# Patient Record
Sex: Female | Born: 1985
Health system: Southern US, Community
[De-identification: ages and names within clinical notes are randomized; demographics above are authoritative.]

## PROBLEM LIST (undated history)

## (undated) ENCOUNTER — Inpatient Hospital Stay (HOSPITAL_COMMUNITY): Payer: Self-pay

## (undated) DIAGNOSIS — J309 Allergic rhinitis, unspecified: Secondary | ICD-10-CM

## (undated) DIAGNOSIS — O1490 Unspecified pre-eclampsia, unspecified trimester: Secondary | ICD-10-CM

## (undated) DIAGNOSIS — E739 Lactose intolerance, unspecified: Secondary | ICD-10-CM

## (undated) DIAGNOSIS — R112 Nausea with vomiting, unspecified: Secondary | ICD-10-CM

## (undated) DIAGNOSIS — K6289 Other specified diseases of anus and rectum: Secondary | ICD-10-CM

## (undated) DIAGNOSIS — K921 Melena: Secondary | ICD-10-CM

## (undated) DIAGNOSIS — T7840XA Allergy, unspecified, initial encounter: Secondary | ICD-10-CM

## (undated) DIAGNOSIS — G43909 Migraine, unspecified, not intractable, without status migrainosus: Secondary | ICD-10-CM

## (undated) DIAGNOSIS — R109 Unspecified abdominal pain: Secondary | ICD-10-CM

## (undated) DIAGNOSIS — L509 Urticaria, unspecified: Secondary | ICD-10-CM

## (undated) DIAGNOSIS — R87629 Unspecified abnormal cytological findings in specimens from vagina: Secondary | ICD-10-CM

## (undated) DIAGNOSIS — F419 Anxiety disorder, unspecified: Secondary | ICD-10-CM

## (undated) DIAGNOSIS — Z789 Other specified health status: Secondary | ICD-10-CM

## (undated) DIAGNOSIS — O139 Gestational [pregnancy-induced] hypertension without significant proteinuria, unspecified trimester: Secondary | ICD-10-CM

## (undated) DIAGNOSIS — N390 Urinary tract infection, site not specified: Secondary | ICD-10-CM

## (undated) DIAGNOSIS — H1010 Acute atopic conjunctivitis, unspecified eye: Secondary | ICD-10-CM

## (undated) HISTORY — DX: Anxiety disorder, unspecified: F41.9

## (undated) HISTORY — DX: Nausea with vomiting, unspecified: R11.2

## (undated) HISTORY — DX: Urinary tract infection, site not specified: N39.0

## (undated) HISTORY — DX: Unspecified pre-eclampsia, unspecified trimester: O14.90

## (undated) HISTORY — DX: Other specified diseases of anus and rectum: K62.89

## (undated) HISTORY — DX: Melena: K92.1

## (undated) HISTORY — DX: Gestational (pregnancy-induced) hypertension without significant proteinuria, unspecified trimester: O13.9

## (undated) HISTORY — DX: Allergic rhinitis, unspecified: J30.9

## (undated) HISTORY — DX: Urticaria, unspecified: L50.9

## (undated) HISTORY — DX: Migraine, unspecified, not intractable, without status migrainosus: G43.909

## (undated) HISTORY — DX: Allergic rhinitis, unspecified: H10.10

## (undated) HISTORY — DX: Lactose intolerance, unspecified: E73.9

## (undated) HISTORY — PX: BREAST BIOPSY: SHX20

## (undated) HISTORY — DX: Unspecified abdominal pain: R10.9

## (undated) HISTORY — DX: Allergy, unspecified, initial encounter: T78.40XA

---
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
## 2005-04-09 ENCOUNTER — Other Ambulatory Visit: Admission: RE | Admit: 2005-04-09 | Discharge: 2005-04-09 | Payer: Self-pay | Admitting: Obstetrics and Gynecology

## 2005-10-16 ENCOUNTER — Other Ambulatory Visit: Admission: RE | Admit: 2005-10-16 | Discharge: 2005-10-16 | Payer: Self-pay | Admitting: Obstetrics and Gynecology

## 2008-09-01 HISTORY — PX: TONSILLECTOMY: SUR1361

## 2008-12-05 ENCOUNTER — Encounter: Admission: RE | Admit: 2008-12-05 | Discharge: 2008-12-05 | Payer: Self-pay | Admitting: Family Medicine

## 2009-01-19 ENCOUNTER — Encounter: Admission: RE | Admit: 2009-01-19 | Discharge: 2009-01-19 | Payer: Self-pay | Admitting: Gastroenterology

## 2009-03-12 ENCOUNTER — Emergency Department (HOSPITAL_COMMUNITY): Admission: EM | Admit: 2009-03-12 | Discharge: 2009-03-12 | Payer: Self-pay | Admitting: Emergency Medicine

## 2009-07-06 ENCOUNTER — Ambulatory Visit (HOSPITAL_COMMUNITY): Admission: RE | Admit: 2009-07-06 | Discharge: 2009-07-06 | Payer: Self-pay | Admitting: Gastroenterology

## 2009-09-06 ENCOUNTER — Encounter: Admission: RE | Admit: 2009-09-06 | Discharge: 2009-09-06 | Payer: Self-pay | Admitting: Gastroenterology

## 2010-09-22 ENCOUNTER — Encounter: Payer: Self-pay | Admitting: Urology

## 2010-12-08 LAB — COMPREHENSIVE METABOLIC PANEL
ALT: 19 U/L (ref 0–35)
Alkaline Phosphatase: 49 U/L (ref 39–117)
Glucose, Bld: 80 mg/dL (ref 70–99)
Potassium: 4.6 mEq/L (ref 3.5–5.1)
Sodium: 136 mEq/L (ref 135–145)
Total Protein: 6.8 g/dL (ref 6.0–8.3)

## 2010-12-08 LAB — URINALYSIS, ROUTINE W REFLEX MICROSCOPIC
Bilirubin Urine: NEGATIVE
Glucose, UA: NEGATIVE mg/dL
Hgb urine dipstick: NEGATIVE
Ketones, ur: NEGATIVE mg/dL
Protein, ur: NEGATIVE mg/dL

## 2010-12-08 LAB — DIFFERENTIAL
Basophils Relative: 0 % (ref 0–1)
Eosinophils Absolute: 0.1 10*3/uL (ref 0.0–0.7)
Eosinophils Relative: 2 % (ref 0–5)
Monocytes Absolute: 0.6 10*3/uL (ref 0.1–1.0)
Monocytes Relative: 7 % (ref 3–12)
Neutrophils Relative %: 68 % (ref 43–77)

## 2010-12-08 LAB — CBC
Hemoglobin: 13.2 g/dL (ref 12.0–15.0)
RBC: 4.17 MIL/uL (ref 3.87–5.11)
RDW: 12.6 % (ref 11.5–15.5)

## 2011-10-21 ENCOUNTER — Ambulatory Visit
Admission: RE | Admit: 2011-10-21 | Discharge: 2011-10-21 | Disposition: A | Discharge status: Home / Self Care | Payer: BC Managed Care – PPO | Source: Ambulatory Visit | Attending: Allergy | Admitting: Allergy

## 2011-10-21 ENCOUNTER — Other Ambulatory Visit: Payer: Self-pay | Admitting: Allergy

## 2011-10-21 DIAGNOSIS — J329 Chronic sinusitis, unspecified: Secondary | ICD-10-CM

## 2013-03-01 LAB — OB RESULTS CONSOLE HEPATITIS B SURFACE ANTIGEN: Hepatitis B Surface Ag: NEGATIVE

## 2013-03-01 LAB — OB RESULTS CONSOLE ABO/RH: RH Type: POSITIVE

## 2013-03-01 LAB — OB RESULTS CONSOLE GC/CHLAMYDIA
CHLAMYDIA, DNA PROBE: NEGATIVE
GC PROBE AMP, GENITAL: NEGATIVE

## 2013-03-01 LAB — OB RESULTS CONSOLE GBS: STREP GROUP B AG: NEGATIVE

## 2013-03-01 LAB — OB RESULTS CONSOLE RPR: RPR: NONREACTIVE

## 2013-03-01 LAB — OB RESULTS CONSOLE ANTIBODY SCREEN: ANTIBODY SCREEN: NEGATIVE

## 2013-03-01 LAB — OB RESULTS CONSOLE HIV ANTIBODY (ROUTINE TESTING): HIV: NONREACTIVE

## 2013-03-01 LAB — OB RESULTS CONSOLE RUBELLA ANTIBODY, IGM: Rubella: IMMUNE

## 2013-09-01 NOTE — L&D Delivery Note (Signed)
Delivery Note At 3:08 PM a viable female was delivered via Vaginal, Spontaneous Delivery Apgar 9 9   Placenta status:spontaneously with 3 vessel cord , .  Cord:  with the following complications:none .  Cord pH: not obtained  Anesthesia: Epidural  Episiotomy: None Lacerations: None Suture Repair: 3.0 chromic Est. Blood Loss (mL): 300  Mom to postpartum.  Baby to Couplet care / Skin to Skin.  [name] L 09/14/2013, 3:43 PM

## 2013-09-11 ENCOUNTER — Encounter (HOSPITAL_COMMUNITY): Payer: Self-pay | Admitting: *Deleted

## 2013-09-11 ENCOUNTER — Inpatient Hospital Stay (HOSPITAL_COMMUNITY)
Admission: AD | Admit: 2013-09-11 | Discharge: 2013-09-11 | Disposition: A | Discharge status: Home / Self Care | Payer: BC Managed Care – PPO | Source: Ambulatory Visit | Attending: Obstetrics and Gynecology | Admitting: Obstetrics and Gynecology

## 2013-09-11 DIAGNOSIS — R030 Elevated blood-pressure reading, without diagnosis of hypertension: Secondary | ICD-10-CM | POA: Insufficient documentation

## 2013-09-11 DIAGNOSIS — IMO0001 Reserved for inherently not codable concepts without codable children: Secondary | ICD-10-CM

## 2013-09-11 DIAGNOSIS — O479 False labor, unspecified: Secondary | ICD-10-CM | POA: Insufficient documentation

## 2013-09-11 DIAGNOSIS — O9989 Other specified diseases and conditions complicating pregnancy, childbirth and the puerperium: Principal | ICD-10-CM

## 2013-09-11 DIAGNOSIS — O99891 Other specified diseases and conditions complicating pregnancy: Secondary | ICD-10-CM | POA: Insufficient documentation

## 2013-09-11 HISTORY — DX: Other specified health status: Z78.9

## 2013-09-11 LAB — CBC
HEMATOCRIT: 33.9 % — AB (ref 36.0–46.0)
Hemoglobin: 11.9 g/dL — ABNORMAL LOW (ref 12.0–15.0)
MCH: 30.2 pg (ref 26.0–34.0)
MCHC: 35.1 g/dL (ref 30.0–36.0)
MCV: 86 fL (ref 78.0–100.0)
PLATELETS: 204 10*3/uL (ref 150–400)
RBC: 3.94 MIL/uL (ref 3.87–5.11)
RDW: 12.7 % (ref 11.5–15.5)
WBC: 10.6 10*3/uL — AB (ref 4.0–10.5)

## 2013-09-11 LAB — URINALYSIS, ROUTINE W REFLEX MICROSCOPIC
Bilirubin Urine: NEGATIVE
GLUCOSE, UA: NEGATIVE mg/dL
KETONES UR: NEGATIVE mg/dL
Nitrite: NEGATIVE
PH: 6 (ref 5.0–8.0)
Protein, ur: NEGATIVE mg/dL
Specific Gravity, Urine: 1.02 (ref 1.005–1.030)
Urobilinogen, UA: 0.2 mg/dL (ref 0.0–1.0)

## 2013-09-11 LAB — URINE MICROSCOPIC-ADD ON

## 2013-09-11 LAB — COMPREHENSIVE METABOLIC PANEL
ALBUMIN: 2.8 g/dL — AB (ref 3.5–5.2)
ALK PHOS: 130 U/L — AB (ref 39–117)
ALT: 13 U/L (ref 0–35)
AST: 20 U/L (ref 0–37)
BUN: 11 mg/dL (ref 6–23)
CO2: 24 mEq/L (ref 19–32)
Calcium: 8.7 mg/dL (ref 8.4–10.5)
Chloride: 105 mEq/L (ref 96–112)
Creatinine, Ser: 0.65 mg/dL (ref 0.50–1.10)
GFR calc Af Amer: >90 mL/min (ref >90)
GFR calc non Af Amer: >90 mL/min (ref >90)
Glucose, Bld: 77 mg/dL (ref 70–99)
POTASSIUM: 4.6 meq/L (ref 3.7–5.3)
SODIUM: 138 meq/L (ref 137–147)
TOTAL PROTEIN: 6.1 g/dL (ref 6.0–8.3)
Total Bilirubin: <0.2 mg/dL — ABNORMAL LOW (ref 0.3–1.2)

## 2013-09-11 LAB — WET PREP, GENITAL
CLUE CELLS WET PREP: NONE SEEN
Trich, Wet Prep: NONE SEEN
YEAST WET PREP: NONE SEEN

## 2013-09-11 LAB — PROTEIN / CREATININE RATIO, URINE
CREATININE, URINE: 74.31 mg/dL
PROTEIN CREATININE RATIO: 0.1 (ref 0.00–0.15)
TOTAL PROTEIN, URINE: 7.2 mg/dL

## 2013-09-11 LAB — URIC ACID: URIC ACID, SERUM: 4.6 mg/dL (ref 2.4–7.0)

## 2013-09-11 LAB — AMNISURE RUPTURE OF MEMBRANE (ROM) NOT AT ARMC: AMNISURE: NEGATIVE

## 2013-09-11 LAB — LACTATE DEHYDROGENASE: LDH: 187 U/L (ref 94–250)

## 2013-09-11 NOTE — MAU Provider Note (Signed)
Chief Complaint:  Rupture of Membranes   First Provider Initiated Contact with Patient 09/11/13 1716      HPI: Nancy Boyer is a 28 y.o. G1P0 at 3855w5d who presents to maternity admissions reporting gush of clear fluid into toilet after urinating at noon today.  She had more fluid, enough to soak a pantyliner x1 afterwards but no current leakage.  She reports contractions x2 which were painful last night but none today.  She has hx of frequent yeast infections during this pregnancy but denies itching/burning today.  She reports good fetal movement, denies regular contractions, h/a, epigastric pain, visual disturbances, vaginal bleeding, vaginal itching/burning, urinary symptoms, dizziness, n/v, or fever/chills.    Past Medical History: Past Medical History  Diagnosis Date  . Medical history non-contributory     Past obstetric history: OB History  Gravida Para Term Preterm AB SAB TAB Ectopic Multiple Living  1             # Outcome Date GA Lbr Len/2nd Weight Sex Delivery Anes PTL Lv  1 CUR               Past Surgical History: Past Surgical History  Procedure Laterality Date  . Tonsillectomy    . Wisdom tooth extraction      Family History: History reviewed. No pertinent family history.  Social History: History  Substance Use Topics  . Smoking status: Never Smoker   . Smokeless tobacco: Never Used  . Alcohol Use: No    Allergies:  Allergies  Allergen Reactions  . Penicillins Swelling and Rash  . Codeine Other (See Comments)    Unknown childhood rxn    Meds:  No prescriptions prior to admission    ROS: Pertinent findings in history of present illness.  Physical Exam  Blood pressure 143/93, pulse 82, temperature 98.3 F (36.8 C), resp. rate 18, height 5\' 4"  (1.626 m), weight 69.4 kg (153 lb), last menstrual period 12/14/2012. GENERAL: Well-developed, well-nourished female in no acute distress.  HEENT: normocephalic HEART: normal rate RESP: normal  effort ABDOMEN: Soft, non-tender, gravid appropriate for gestational age EXTREMITIES: Nontender, no edema NEURO: alert and oriented SPECULUM EXAM: Negative pooling, moderate amount thick white discharge clinging to vaginal walls  Dilation: 3 Effacement (%): 90 Cervical Position: Posterior Presentation: Vertex Exam by:: LLeftwich-Kirby, CNM Negative ferning on slide  FHT:  Baseline 145 , moderate variability, accelerations present, no decelerations Contractions: irregular, mild to palpation   Labs: Results for orders placed during the hospital encounter of 09/11/13 (from the past 24 hour(s))  AMNISURE RUPTURE OF MEMBRANE (ROM)     Status: None   Collection Time    09/11/13  5:24 PM      Result Value Range   Amnisure ROM NEGATIVE    WET PREP, GENITAL     Status: Abnormal   Collection Time    09/11/13  5:24 PM      Result Value Range   Yeast Wet Prep HPF POC NONE SEEN  NONE SEEN   Trich, Wet Prep NONE SEEN  NONE SEEN   Clue Cells Wet Prep HPF POC NONE SEEN  NONE SEEN   WBC, Wet Prep HPF POC MANY (*) NONE SEEN  URINALYSIS, ROUTINE W REFLEX MICROSCOPIC     Status: Abnormal   Collection Time    09/11/13  5:30 PM      Result Value Range   Color, Urine YELLOW  YELLOW   APPearance CLEAR  CLEAR   Specific Gravity, Urine 1.020  1.005 - 1.030   pH 6.0  5.0 - 8.0   Glucose, UA NEGATIVE  NEGATIVE mg/dL   Hgb urine dipstick TRACE (*) NEGATIVE   Bilirubin Urine NEGATIVE  NEGATIVE   Ketones, ur NEGATIVE  NEGATIVE mg/dL   Protein, ur NEGATIVE  NEGATIVE mg/dL   Urobilinogen, UA 0.2  0.0 - 1.0 mg/dL   Nitrite NEGATIVE  NEGATIVE   Leukocytes, UA TRACE (*) NEGATIVE  PROTEIN / CREATININE RATIO, URINE     Status: None   Collection Time    09/11/13  5:30 PM      Result Value Range   Creatinine, Urine 74.31     Total Protein, Urine 7.2     PROTEIN CREATININE RATIO 0.10  0.00 - 0.15  URINE MICROSCOPIC-ADD ON     Status: Abnormal   Collection Time    09/11/13  5:30 PM      Result Value  Range   Squamous Epithelial / LPF FEW (*) RARE   WBC, UA 3-6  <3 WBC/hpf   RBC / HPF 0-2  <3 RBC/hpf   Bacteria, UA FEW (*) RARE  CBC     Status: Abnormal   Collection Time    09/11/13  5:40 PM      Result Value Range   WBC 10.6 (*) 4.0 - 10.5 K/uL   RBC 3.94  3.87 - 5.11 MIL/uL   Hemoglobin 11.9 (*) 12.0 - 15.0 g/dL   HCT 16.1 (*) 09.6 - 04.5 %   MCV 86.0  78.0 - 100.0 fL   MCH 30.2  26.0 - 34.0 pg   MCHC 35.1  30.0 - 36.0 g/dL   RDW 40.9  81.1 - 91.4 %   Platelets 204  150 - 400 K/uL  COMPREHENSIVE METABOLIC PANEL     Status: Abnormal   Collection Time    09/11/13  5:40 PM      Result Value Range   Sodium 138  137 - 147 mEq/L   Potassium 4.6  3.7 - 5.3 mEq/L   Chloride 105  96 - 112 mEq/L   CO2 24  19 - 32 mEq/L   Glucose, Bld 77  70 - 99 mg/dL   BUN 11  6 - 23 mg/dL   Creatinine, Ser 7.82  0.50 - 1.10 mg/dL   Calcium 8.7  8.4 - 95.6 mg/dL   Total Protein 6.1  6.0 - 8.3 g/dL   Albumin 2.8 (*) 3.5 - 5.2 g/dL   AST 20  0 - 37 U/L   ALT 13  0 - 35 U/L   Alkaline Phosphatase 130 (*) 39 - 117 U/L   Total Bilirubin <0.2 (*) 0.3 - 1.2 mg/dL   GFR calc non Af Amer >90  >90 mL/min   GFR calc Af Amer >90  >90 mL/min  URIC ACID     Status: None   Collection Time    09/11/13  5:40 PM      Result Value Range   Uric Acid, Serum 4.6  2.4 - 7.0 mg/dL  LACTATE DEHYDROGENASE     Status: None   Collection Time    09/11/13  5:40 PM      Result Value Range   LDH 187  94 - 250 U/L    Assessment: 1. Threatened labor at term   2. Elevated BP     Plan: Consult Dr Renaldo Fiddler Discharge home with preeclampsia and labor precautions Call office tomorrow, come in for BP check Return to MAU as needed  Follow-up Information   Follow up with [facility], [place].. Schedule an appointment as soon as possible for a visit in 1 day.   Contact information:   [address] [place] [address] [phone]       Medication List          acetaminophen 500 MG tablet  Commonly known as:  TYLENOL  Take 1,000 mg by mouth every 6 (six) hours as needed for mild pain.     prenatal multivitamin Tabs tablet  Take 1 tablet by mouth daily at 12 noon.     valACYclovir 500 MG tablet  Commonly known as:  VALTREX  Take 500 mg by mouth daily as needed (cold sore outbreak).        Sharen Counter Certified Nurse-Midwife 09/11/2013 8:24 PM

## 2013-09-11 NOTE — MAU Note (Signed)
Pt states here for lof around 12 noon. Clear fluid. Was seen 09/08/2013 and was 3cm/80% per Dr. Vincente PoliGrewal.

## 2013-09-11 NOTE — MAU Note (Signed)
Pt presents with complaints of leakage of fluid since noon today. Denies any bleeding. States baby is active.

## 2013-09-11 NOTE — Discharge Instructions (Signed)
Reasons to return to MAU:  ° °1.  Contractions are  5 minutes apart or less, each last 1 minute, these have been going on for 1-2 hours, and you cannot walk or talk during them °2.  You have a large gush of fluid, or a trickle of fluid that will not stop and you have to wear a pad °3.  You have bleeding that is bright red, heavier than spotting--like menstrual bleeding (spotting can be normal in early labor or after a check of your cervix) °4.  You do not feel the baby moving like he/she normally does ° °Hypertension During Pregnancy °Hypertension is also called high blood pressure. It can occur at any time in life and during pregnancy. When you have hypertension, there is extra pressure inside your blood vessels that carry blood from the heart to the rest of your body (arteries). Hypertension during pregnancy can cause problems for you and your baby. Your baby might not weigh as much as it should at birth or might be born early (premature). Very bad cases of hypertension during pregnancy can be life threatening.  °Different types of hypertension can occur during pregnancy.  °· Chronic hypertension. This happens when a woman has hypertension before pregnancy and it continues during pregnancy. °· Gestational hypertension. This is when hypertension develops during pregnancy. °· Preeclampsia or toxemia of pregnancy. This is a very serious type of hypertension that develops only during pregnancy. It is a disease that affects the whole body (systemic) and can be very dangerous for both mother and baby.   °Gestational hypertension and preeclampsia usually go away after your baby is born. Blood pressure generally stabilizes within 6 weeks. Women who have hypertension during pregnancy have a greater chance of developing hypertension later in life or with future pregnancies. °RISK FACTORS °Some factors make you more likely to develop hypertension during pregnancy. Risk factors include: °· Having hypertension before  pregnancy. °· Having hypertension during a previous pregnancy. °· Being overweight. °· Being older than 40. °· Being pregnant with more than one baby (multiples). °· Having diabetes or kidney problems. °SIGNS AND SYMPTOMS °Chronic and gestational hypertension rarely cause symptoms. Preeclampsia has symptoms, which may include: °· Increased protein in your urine. Your health care provider will check for this at every prenatal visit. °· Swelling of your hands and face. °· Rapid weight gain. °· Headaches. °· Visual changes. °· Being bothered by light. °· Abdominal pain, especially in the right upper area. °· Chest pain. °· Shortness of breath. °· Increased reflexes. °· Seizures. Seizures occur with a more severe form of preeclampsia, called eclampsia. °DIAGNOSIS  °· You may be diagnosed with hypertension during a regular prenatal exam. At each visit, tests may include: °· Blood pressure checks. °· A urine test to check for protein in your urine. °· The type of hypertension you are diagnosed with depends on when you developed it. It also depends on your specific blood pressure reading. °· Developing hypertension before 20 weeks of pregnancy is consistent with chronic hypertension. °· Developing hypertension after 20 weeks of pregnancy is consistent with gestational hypertension. °· Hypertension with increased urinary protein is diagnosed as preeclampsia. °· Blood pressure measurements that stay above 160 systolic or 110 diastolic are a sign of severe preeclampsia. °TREATMENT °Treatment for hypertension during pregnancy varies. Treatment depends on the type of hypertension and how serious it is. °· If you take medicine for chronic hypertension, you may need to switch medicines. °· Drugs called ACE inhibitors should not be taken   during pregnancy. °· Low-dose aspirin may be suggested for women who have risk factors for preeclampsia. °· If you have gestational hypertension, you may need to take a blood pressure medicine  that is safe during pregnancy. Your health care provider will recommend the appropriate medicine. °· If you have severe preeclampsia, you may need to be in the hospital. Health care providers will watch you and the baby very closely. You also may need to take medicine (magnesium sulfate) to prevent seizures and lower blood pressure. °· Sometimes an early delivery is needed. This may be the case if the condition worsens. It would be done to protect you and the baby. The only cure for preeclampsia is delivery. °HOME CARE INSTRUCTIONS °· Schedule and keep all of your regular appointments for prenatal care. °· Only take over-the-counter or prescription medicines as directed by your health care provider. Tell your health care provider about all medicines you take. °· Eat as little salt as possible. °· Get regular exercise. °· Do not drink alcohol. °· Do not use tobacco products. °· Do not drink products with caffeine. °· Lie on your left side when resting. °SEEK IMMEDIATE MEDICAL CARE IF: °· You have severe abdominal pain. °· You have sudden swelling in the hands, ankles, or face. °· You gain 4 pounds (1.8 kg) or more in 1 week. °· You vomit repeatedly. °· You have vaginal bleeding. °· You do not feel the baby moving as much. °· You have a headache. °· You have blurred or double vision. °· You have muscle twitching or spasms. °· You have shortness of breath. °· You have blue fingernails and lips. °· You have blood in your urine. °MAKE SURE YOU: °· Understand these instructions. °· Will watch your condition. °· Will get help right away if you are not doing well or get worse. °Document Released: 05/06/2011 Document Revised: 06/08/2013 Document Reviewed: 03/17/2013 °[employer]® Patient Information ©2014 [employer]. ° °

## 2013-09-12 LAB — URINE CULTURE: Colony Count: 6000

## 2013-09-13 ENCOUNTER — Encounter (HOSPITAL_COMMUNITY): Payer: Self-pay | Admitting: *Deleted

## 2013-09-13 ENCOUNTER — Inpatient Hospital Stay (HOSPITAL_COMMUNITY)
Admission: AD | Admit: 2013-09-13 | Discharge: 2013-09-16 | DRG: 775 | Disposition: A | Discharge status: Home / Self Care | Payer: BC Managed Care – PPO | Source: Ambulatory Visit | Attending: Obstetrics and Gynecology | Admitting: Obstetrics and Gynecology

## 2013-09-13 DIAGNOSIS — O139 Gestational [pregnancy-induced] hypertension without significant proteinuria, unspecified trimester: Principal | ICD-10-CM | POA: Diagnosis present

## 2013-09-13 DIAGNOSIS — Z3490 Encounter for supervision of normal pregnancy, unspecified, unspecified trimester: Secondary | ICD-10-CM

## 2013-09-13 LAB — COMPREHENSIVE METABOLIC PANEL
ALK PHOS: 132 U/L — AB (ref 39–117)
ALT: 11 U/L (ref 0–35)
AST: 20 U/L (ref 0–37)
Albumin: 2.9 g/dL — ABNORMAL LOW (ref 3.5–5.2)
BUN: 8 mg/dL (ref 6–23)
CO2: 22 mEq/L (ref 19–32)
CREATININE: 0.63 mg/dL (ref 0.50–1.10)
Calcium: 8.9 mg/dL (ref 8.4–10.5)
Chloride: 102 mEq/L (ref 96–112)
GFR calc Af Amer: >90 mL/min (ref >90)
GFR calc non Af Amer: >90 mL/min (ref >90)
Glucose, Bld: 67 mg/dL — ABNORMAL LOW (ref 70–99)
POTASSIUM: 3.8 meq/L (ref 3.7–5.3)
Sodium: 136 mEq/L — ABNORMAL LOW (ref 137–147)
TOTAL PROTEIN: 6.5 g/dL (ref 6.0–8.3)
Total Bilirubin: 0.3 mg/dL (ref 0.3–1.2)

## 2013-09-13 LAB — URINALYSIS, ROUTINE W REFLEX MICROSCOPIC
BILIRUBIN URINE: NEGATIVE
Glucose, UA: NEGATIVE mg/dL
Ketones, ur: NEGATIVE mg/dL
NITRITE: NEGATIVE
PH: 6 (ref 5.0–8.0)
Protein, ur: NEGATIVE mg/dL
SPECIFIC GRAVITY, URINE: 1.015 (ref 1.005–1.030)
Urobilinogen, UA: 0.2 mg/dL (ref 0.0–1.0)

## 2013-09-13 LAB — CBC
HCT: 36.2 % (ref 36.0–46.0)
Hemoglobin: 12.8 g/dL (ref 12.0–15.0)
MCH: 30.5 pg (ref 26.0–34.0)
MCHC: 35.4 g/dL (ref 30.0–36.0)
MCV: 86.4 fL (ref 78.0–100.0)
PLATELETS: 216 10*3/uL (ref 150–400)
RBC: 4.19 MIL/uL (ref 3.87–5.11)
RDW: 12.9 % (ref 11.5–15.5)
WBC: 10.5 10*3/uL (ref 4.0–10.5)

## 2013-09-13 LAB — URINE MICROSCOPIC-ADD ON

## 2013-09-13 LAB — LACTATE DEHYDROGENASE: LDH: 237 U/L (ref 94–250)

## 2013-09-13 LAB — RPR: RPR: NONREACTIVE

## 2013-09-13 MED ORDER — ZOLPIDEM TARTRATE 5 MG PO TABS: 5.0000 mg | ORAL_TABLET | Freq: Every evening | ORAL | Status: DC | PRN

## 2013-09-13 MED ORDER — OXYCODONE-ACETAMINOPHEN 5-325 MG PO TABS: 1.0000 | ORAL_TABLET | ORAL | Status: DC | PRN

## 2013-09-13 MED ORDER — OXYTOCIN 40 UNITS IN LACTATED RINGERS INFUSION - SIMPLE MED
1.0000 m[IU]/min | INTRAVENOUS | Status: DC
Start: 2013-09-13 — End: 2013-09-14
  Administered 2013-09-13: 2 m[IU]/min via INTRAVENOUS
  Filled 2013-09-13: qty 1000

## 2013-09-13 MED ORDER — ONDANSETRON HCL 4 MG/2ML IJ SOLN: 4.0000 mg | Freq: Four times a day (QID) | INTRAMUSCULAR | Status: DC | PRN

## 2013-09-13 MED ORDER — FLEET ENEMA 7-19 GM/118ML RE ENEM: 1.0000 | ENEMA | Freq: Every day | RECTAL | Status: DC | PRN

## 2013-09-13 MED ORDER — OXYTOCIN 40 UNITS IN LACTATED RINGERS INFUSION - SIMPLE MED: 62.5000 mL/h | INTRAVENOUS | Status: DC

## 2013-09-13 MED ORDER — OXYTOCIN BOLUS FROM INFUSION: 500.0000 mL | INTRAVENOUS | Status: DC

## 2013-09-13 MED ORDER — LACTATED RINGERS IV SOLN
INTRAVENOUS | Status: DC
  Administered 2013-09-14 (×2): via INTRAVENOUS

## 2013-09-13 MED ORDER — CITRIC ACID-SODIUM CITRATE 334-500 MG/5ML PO SOLN: 30.0000 mL | ORAL | Status: DC | PRN

## 2013-09-13 MED ORDER — IBUPROFEN 600 MG PO TABS: 600.0000 mg | ORAL_TABLET | Freq: Four times a day (QID) | ORAL | Status: DC | PRN

## 2013-09-13 MED ORDER — LACTATED RINGERS IV SOLN
500.0000 mL | INTRAVENOUS | Status: DC | PRN
Start: 2013-09-13 — End: 2013-09-14

## 2013-09-13 MED ORDER — MISOPROSTOL 25 MCG QUARTER TABLET
25.0000 ug | ORAL_TABLET | ORAL | Status: DC | PRN
  Administered 2013-09-13: 25 ug via VAGINAL
  Filled 2013-09-13: qty 0.25
  Filled 2013-09-13: qty 1

## 2013-09-13 MED ORDER — ACETAMINOPHEN 325 MG PO TABS: 650.0000 mg | ORAL_TABLET | ORAL | Status: DC | PRN

## 2013-09-13 MED ORDER — LIDOCAINE HCL (PF) 1 % IJ SOLN
30.0000 mL | INTRAMUSCULAR | Status: DC | PRN
  Filled 2013-09-13: qty 30

## 2013-09-13 NOTE — H&P (Signed)
Nancy Boyer is a 28 y.o. female presenting for IOL.  [facility] with new onset elevated BPs.  Normal labs.  Today despite bedrest, has mild HA, and elevated BPs.  In office and at home, BPs 140s over 90s.  No RUQ, CNS sxs.  Admitted for induction of labor. History OB History   Grav Para Term Preterm Abortions TAB SAB Ect Mult Living   1              Past Medical History  Diagnosis Date  . Medical history non-contributory    Past Surgical History  Procedure Laterality Date  . Tonsillectomy    . Wisdom tooth extraction     Family History: family history is not on file. Social History:  reports that she has never smoked. She has never used smokeless tobacco. She reports that she does not drink alcohol or use illicit drugs.   Prenatal Transfer Tool  Maternal Diabetes: No Genetic Screening: Normal Maternal Ultrasounds/Referrals: Normal Fetal Ultrasounds or other Referrals:  None Maternal Substance Abuse:  No Significant Maternal Medications:  None Significant Maternal Lab Results:  None Other Comments:  None  ROS  Dilation: 2.5 Effacement (%): 70;80 Exam by:: Dr. Rana SnareLowe Blood pressure 152/99, pulse 76, temperature 98.4 F (36.9 C), resp. rate 18, height 5\' 4"  (1.626 m), weight 68.947 kg (152 lb), last menstrual period 12/14/2012. Exam Physical Exam  I was unable to AROM due to posterior position of cervix and patient unable to tolerate exam Prenatal labs: ABO, Rh: B/Positive/-- (07/01 1557) Antibody: Negative (07/01 1557) Rubella: Immune (07/01 1557) RPR: Nonreactive (07/01 1557)  HBsAg: Negative (07/01 1557)  HIV: Non-reactive (07/01 1557)  GBS: Negative (07/01 1557)   Assessment/Plan: IUP at [redacted] weeks Gestational HTN Labs to R/O preeclamsia - no CNS sxs Plan pitocin, AROM Anticipate SVD   Mandell Pangborn C 09/13/2013, 4:53 PM

## 2013-09-13 NOTE — Progress Notes (Signed)
Discussed SVE with provider--orders to stop pitocin and start cytotec--updated on UCs and FHR

## 2013-09-13 NOTE — Progress Notes (Signed)
Ctxs every 2-3 minutes.  Pt minimally uncomfortable with them.  Per RN exam no Cx Change.  Rn had already discussed options and I discussed continuing pitocin and AROM when feasable, vs change to cytotec.  Pt desires the later, FHR 140s Cat 1 Labs WNL BPs 140-150s/90s

## 2013-09-14 ENCOUNTER — Encounter (HOSPITAL_COMMUNITY): Payer: Self-pay | Admitting: Anesthesiology

## 2013-09-14 ENCOUNTER — Inpatient Hospital Stay (HOSPITAL_COMMUNITY): Payer: BC Managed Care – PPO | Admitting: Anesthesiology

## 2013-09-14 ENCOUNTER — Encounter (HOSPITAL_COMMUNITY): Payer: BC Managed Care – PPO | Admitting: Anesthesiology

## 2013-09-14 DIAGNOSIS — O139 Gestational [pregnancy-induced] hypertension without significant proteinuria, unspecified trimester: Secondary | ICD-10-CM

## 2013-09-14 LAB — URINE CULTURE
Colony Count: NO GROWTH
Culture: NO GROWTH

## 2013-09-14 MED ORDER — EPHEDRINE 5 MG/ML INJ
10.0000 mg | INTRAVENOUS | Status: DC | PRN
  Filled 2013-09-14: qty 4
  Filled 2013-09-14: qty 2

## 2013-09-14 MED ORDER — WITCH HAZEL-GLYCERIN EX PADS: 1.0000 "application " | MEDICATED_PAD | CUTANEOUS | Status: DC | PRN

## 2013-09-14 MED ORDER — DIPHENHYDRAMINE HCL 25 MG PO CAPS: 25.0000 mg | ORAL_CAPSULE | Freq: Four times a day (QID) | ORAL | Status: DC | PRN

## 2013-09-14 MED ORDER — DIPHENHYDRAMINE HCL 50 MG/ML IJ SOLN: 12.5000 mg | INTRAMUSCULAR | Status: DC | PRN

## 2013-09-14 MED ORDER — ZOLPIDEM TARTRATE 5 MG PO TABS: 5.0000 mg | ORAL_TABLET | Freq: Every evening | ORAL | Status: DC | PRN

## 2013-09-14 MED ORDER — MEASLES, MUMPS & RUBELLA VAC ~~LOC~~ INJ
0.5000 mL | INJECTION | Freq: Once | SUBCUTANEOUS | Status: DC
  Filled 2013-09-14: qty 0.5

## 2013-09-14 MED ORDER — SENNOSIDES-DOCUSATE SODIUM 8.6-50 MG PO TABS
2.0000 | ORAL_TABLET | ORAL | Status: DC
  Administered 2013-09-15: 2 via ORAL
  Filled 2013-09-14 (×2): qty 2

## 2013-09-14 MED ORDER — LANOLIN HYDROUS EX OINT: TOPICAL_OINTMENT | CUTANEOUS | Status: DC | PRN

## 2013-09-14 MED ORDER — EPHEDRINE 5 MG/ML INJ
10.0000 mg | INTRAVENOUS | Status: DC | PRN
  Filled 2013-09-14: qty 2

## 2013-09-14 MED ORDER — PHENYLEPHRINE 40 MCG/ML (10ML) SYRINGE FOR IV PUSH (FOR BLOOD PRESSURE SUPPORT)
80.0000 ug | PREFILLED_SYRINGE | INTRAVENOUS | Status: DC | PRN
  Filled 2013-09-14: qty 2

## 2013-09-14 MED ORDER — OXYCODONE-ACETAMINOPHEN 5-325 MG PO TABS
1.0000 | ORAL_TABLET | ORAL | Status: DC | PRN
  Administered 2013-09-14 – 2013-09-16 (×7): 1 via ORAL
  Filled 2013-09-14 (×7): qty 1

## 2013-09-14 MED ORDER — LACTATED RINGERS IV SOLN: 500.0000 mL | Freq: Once | INTRAVENOUS | Status: DC

## 2013-09-14 MED ORDER — FENTANYL 2.5 MCG/ML BUPIVACAINE 1/10 % EPIDURAL INFUSION (WH - ANES)
INTRAMUSCULAR | Status: DC | PRN
Start: 2013-09-14 — End: 2013-09-14
  Administered 2013-09-14: 14 mL/h via EPIDURAL

## 2013-09-14 MED ORDER — BISACODYL 10 MG RE SUPP: 10.0000 mg | Freq: Every day | RECTAL | Status: DC | PRN

## 2013-09-14 MED ORDER — SIMETHICONE 80 MG PO CHEW: 80.0000 mg | CHEWABLE_TABLET | ORAL | Status: DC | PRN

## 2013-09-14 MED ORDER — PHENYLEPHRINE 40 MCG/ML (10ML) SYRINGE FOR IV PUSH (FOR BLOOD PRESSURE SUPPORT)
80.0000 ug | PREFILLED_SYRINGE | INTRAVENOUS | Status: DC | PRN
  Filled 2013-09-14: qty 10
  Filled 2013-09-14: qty 2

## 2013-09-14 MED ORDER — IBUPROFEN 600 MG PO TABS
600.0000 mg | ORAL_TABLET | Freq: Four times a day (QID) | ORAL | Status: DC
  Administered 2013-09-14 – 2013-09-16 (×8): 600 mg via ORAL
  Filled 2013-09-14 (×8): qty 1

## 2013-09-14 MED ORDER — BENZOCAINE-MENTHOL 20-0.5 % EX AERO
1.0000 "application " | INHALATION_SPRAY | CUTANEOUS | Status: DC | PRN
  Administered 2013-09-15 (×2): 1 via TOPICAL
  Filled 2013-09-14: qty 56

## 2013-09-14 MED ORDER — PRENATAL MULTIVITAMIN CH
1.0000 | ORAL_TABLET | Freq: Every day | ORAL | Status: DC
  Administered 2013-09-15 – 2013-09-16 (×2): 1 via ORAL
  Filled 2013-09-14 (×2): qty 1

## 2013-09-14 MED ORDER — ONDANSETRON HCL 4 MG PO TABS: 4.0000 mg | ORAL_TABLET | ORAL | Status: DC | PRN

## 2013-09-14 MED ORDER — ONDANSETRON HCL 4 MG/2ML IJ SOLN: 4.0000 mg | INTRAMUSCULAR | Status: DC | PRN

## 2013-09-14 MED ORDER — FENTANYL 2.5 MCG/ML BUPIVACAINE 1/10 % EPIDURAL INFUSION (WH - ANES)
14.0000 mL/h | INTRAMUSCULAR | Status: DC | PRN
  Administered 2013-09-14: 14 mL/h via EPIDURAL
  Filled 2013-09-14 (×2): qty 125

## 2013-09-14 MED ORDER — FLEET ENEMA 7-19 GM/118ML RE ENEM: 1.0000 | ENEMA | Freq: Every day | RECTAL | Status: DC | PRN

## 2013-09-14 MED ORDER — DIBUCAINE 1 % RE OINT: 1.0000 "application " | TOPICAL_OINTMENT | RECTAL | Status: DC | PRN

## 2013-09-14 MED ORDER — MEDROXYPROGESTERONE ACETATE 150 MG/ML IM SUSP: 150.0000 mg | INTRAMUSCULAR | Status: DC | PRN

## 2013-09-14 MED ORDER — TETANUS-DIPHTH-ACELL PERTUSSIS 5-2.5-18.5 LF-MCG/0.5 IM SUSP: 0.5000 mL | Freq: Once | INTRAMUSCULAR | Status: DC

## 2013-09-14 MED ORDER — LIDOCAINE HCL (PF) 1 % IJ SOLN
INTRAMUSCULAR | Status: DC | PRN
  Administered 2013-09-14 (×2): 4 mL

## 2013-09-14 NOTE — Progress Notes (Signed)
Patient contracting more Afebrile VSS FHR Category 1 Toco UCs every 2 minutes Cervix is 80% 3 cm 0 AROM Clear fluid Epidural ok

## 2013-09-14 NOTE — Anesthesia Preprocedure Evaluation (Signed)
Anesthesia Evaluation  Patient identified by MRN, date of birth, ID band Patient awake    Reviewed: Allergy & Precautions, H&P , NPO status , Patient's Chart, lab work & pertinent test results  Airway Mallampati: III TM Distance: >3 FB Neck ROM: Full    Dental no notable dental hx. (+) Teeth Intact   Pulmonary neg pulmonary ROS,  breath sounds clear to auscultation  Pulmonary exam normal       Cardiovascular negative cardio ROS  Rhythm:Regular Rate:Normal     Neuro/Psych negative neurological ROS  negative psych ROS   GI/Hepatic negative GI ROS, Neg liver ROS,   Endo/Other  negative endocrine ROS  Renal/GU negative Renal ROS  negative genitourinary   Musculoskeletal negative musculoskeletal ROS (+)   Abdominal   Peds  Hematology negative hematology ROS (+)   Anesthesia Other Findings   Reproductive/Obstetrics (+) Pregnancy HSV                           Anesthesia Physical Anesthesia Plan  ASA: II  Anesthesia Plan: Epidural   Post-op Pain Management:    Induction:   Airway Management Planned: Natural Airway  Additional Equipment:   Intra-op Plan:   Post-operative Plan:   Informed Consent: I have reviewed the patients History and Physical, chart, labs and discussed the procedure including the risks, benefits and alternatives for the proposed anesthesia with the patient or authorized representative who has indicated his/her understanding and acceptance.     Plan Discussed with: Anesthesiologist  Anesthesia Plan Comments:         Anesthesia Quick Evaluation

## 2013-09-14 NOTE — Anesthesia Procedure Notes (Signed)
Epidural Patient location during procedure: OB Start time: 09/14/2013 10:09 AM  Staffing Anesthesiologist: Lounell Schumacher A. Performed by: anesthesiologist   Preanesthetic Checklist Completed: patient identified, site marked, surgical consent, pre-op evaluation, timeout performed, IV checked, risks and benefits discussed and monitors and equipment checked  Epidural Patient position: sitting Prep: site prepped and draped and DuraPrep Patient monitoring: continuous pulse ox and blood pressure Approach: midline Injection technique: LOR air  Needle:  Needle type: Tuohy  Needle gauge: 17 G Needle length: 9 cm and 9 Needle insertion depth: 4 cm Catheter type: closed end flexible Catheter size: 19 Gauge Catheter at skin depth: 9 cm Test dose: negative and Other  Assessment Events: blood not aspirated, injection not painful, no injection resistance, negative IV test and paresthesia  Additional Notes Patient identified. Risks and benefits discussed including failed block, incomplete  Pain control, post dural puncture headache, nerve damage, paralysis, blood pressure Changes, nausea, vomiting, reactions to medications-both toxic and allergic and post Partum back pain. All questions were answered. Patient expressed understanding and wished to proceed. Sterile technique was used throughout procedure. Epidural site was Dressed with sterile barrier dressing. No signs of intravascular injection Or signs of intrathecal spread were encountered. Transient paresthesia right leg. Patient was more comfortable after the epidural was dosed. Please see RN's note for documentation of vital signs and FHR which are stable.

## 2013-09-15 LAB — CBC
HCT: 30 % — ABNORMAL LOW (ref 36.0–46.0)
Hemoglobin: 10.5 g/dL — ABNORMAL LOW (ref 12.0–15.0)
MCH: 30.4 pg (ref 26.0–34.0)
MCHC: 35 g/dL (ref 30.0–36.0)
MCV: 87 fL (ref 78.0–100.0)
PLATELETS: 193 10*3/uL (ref 150–400)
RBC: 3.45 MIL/uL — ABNORMAL LOW (ref 3.87–5.11)
RDW: 12.9 % (ref 11.5–15.5)
WBC: 15.1 10*3/uL — ABNORMAL HIGH (ref 4.0–10.5)

## 2013-09-15 NOTE — Progress Notes (Signed)
Post Partum Day 1 Subjective: no complaints, up ad lib, voiding, tolerating PO and + flatus  Objective: Blood pressure 144/85, pulse 74, temperature 98.2 F (36.8 C), temperature source Oral, resp. rate 18, height 5\' 4"  (1.626 m), weight 152 lb (68.947 kg), last menstrual period 12/14/2012, SpO2 100.00%, unknown if currently breastfeeding.  Physical Exam:  General: alert and cooperative Lochia: appropriate Uterine Fundus: firm Incision: perineum intact DVT Evaluation: No evidence of DVT seen on physical exam. Negative Homan's sign. No cords or calf tenderness. No significant calf/ankle edema.   Recent Labs  09/13/13 1308 09/15/13 0540  HGB 12.8 10.5*  HCT 36.2 30.0*    Assessment/Plan: Plan for discharge tomorrow   LOS: 2 days   [name],[name] 09/15/2013, 8:29 AM

## 2013-09-15 NOTE — Anesthesia Postprocedure Evaluation (Signed)
Anesthesia Post Note  Patient: Nancy Boyer  Procedure(s) Performed: * No procedures listed *  Anesthesia type: Epidural  Patient location: Mother/Baby  Post pain: Pain level controlled  Post assessment: Post-op Vital signs reviewed  Last Vitals:  Filed Vitals:   09/15/13 0550  BP: 144/85  Pulse: 74  Temp: 36.8 C  Resp: 18    Post vital signs: Reviewed  Level of consciousness:alert  Complications: No apparent anesthesia complications

## 2013-09-16 MED ORDER — IBUPROFEN 600 MG PO TABS: 600.0000 mg | ORAL_TABLET | Freq: Four times a day (QID) | ORAL | Status: DC

## 2013-09-16 MED ORDER — OXYCODONE-ACETAMINOPHEN 5-325 MG PO TABS: 1.0000 | ORAL_TABLET | ORAL | Status: DC | PRN

## 2013-09-16 NOTE — Discharge Summary (Signed)
Obstetric Discharge Summary Reason for Admission: induction of labor Prenatal Procedures: ultrasound Intrapartum Procedures: spontaneous vaginal delivery Postpartum Procedures: none Complications-Operative and Postpartum: none Hemoglobin  Date Value Range Status  09/15/2013 10.5* 12.0 - 15.0 g/dL Final     DELTA CHECK NOTED     REPEATED TO VERIFY     HCT  Date Value Range Status  09/15/2013 30.0* 36.0 - 46.0 % Final    Physical Exam:  General: alert and cooperative Lochia: appropriate Uterine Fundus: firm Incision: perineum intact DVT Evaluation: No evidence of DVT seen on physical exam. Negative Homan's sign. No cords or calf tenderness. No significant calf/ankle edema.  Discharge Diagnoses: Term Pregnancy-delivered  Discharge Information: Date: 09/16/2013 Activity: pelvic rest Diet: routine Medications: PNV, Ibuprofen and Percocet Condition: stable Instructions: refer to practice specific booklet Discharge to: home   Newborn Data: Live born female  Birth Weight: 6 lb 13.9 oz (3116 g) APGAR: 9, 9  Home with mother.  Ragan Reale G 09/16/2013, 8:40 AM

## 2013-12-14 ENCOUNTER — Other Ambulatory Visit: Payer: Self-pay | Admitting: Obstetrics and Gynecology

## 2013-12-14 DIAGNOSIS — N63 Unspecified lump in unspecified breast: Secondary | ICD-10-CM

## 2013-12-16 ENCOUNTER — Ambulatory Visit
Admission: RE | Admit: 2013-12-16 | Discharge: 2013-12-16 | Disposition: A | Discharge status: Home / Self Care | Payer: BC Managed Care – PPO | Source: Ambulatory Visit | Attending: Obstetrics and Gynecology | Admitting: Obstetrics and Gynecology

## 2013-12-16 DIAGNOSIS — N63 Unspecified lump in unspecified breast: Secondary | ICD-10-CM

## 2013-12-19 ENCOUNTER — Other Ambulatory Visit: Payer: BC Managed Care – PPO

## 2014-03-31 ENCOUNTER — Other Ambulatory Visit: Payer: Self-pay | Admitting: Family Medicine

## 2014-03-31 DIAGNOSIS — R51 Headache: Principal | ICD-10-CM

## 2014-03-31 DIAGNOSIS — G518 Other disorders of facial nerve: Secondary | ICD-10-CM

## 2014-03-31 DIAGNOSIS — R519 Headache, unspecified: Secondary | ICD-10-CM

## 2014-04-09 ENCOUNTER — Ambulatory Visit
Admission: RE | Admit: 2014-04-09 | Discharge: 2014-04-09 | Disposition: A | Discharge status: Home / Self Care | Payer: BC Managed Care – PPO | Source: Ambulatory Visit | Attending: Family Medicine | Admitting: Family Medicine

## 2014-04-09 DIAGNOSIS — R51 Headache: Principal | ICD-10-CM

## 2014-04-09 DIAGNOSIS — G518 Other disorders of facial nerve: Secondary | ICD-10-CM

## 2014-04-09 DIAGNOSIS — R519 Headache, unspecified: Secondary | ICD-10-CM

## 2014-04-21 ENCOUNTER — Encounter: Payer: Self-pay | Admitting: *Deleted

## 2014-04-24 ENCOUNTER — Encounter: Payer: Self-pay | Admitting: Neurology

## 2014-04-24 DIAGNOSIS — E739 Lactose intolerance, unspecified: Secondary | ICD-10-CM

## 2014-04-24 DIAGNOSIS — N390 Urinary tract infection, site not specified: Secondary | ICD-10-CM | POA: Insufficient documentation

## 2014-04-24 DIAGNOSIS — R112 Nausea with vomiting, unspecified: Secondary | ICD-10-CM | POA: Insufficient documentation

## 2014-04-24 DIAGNOSIS — R1031 Right lower quadrant pain: Secondary | ICD-10-CM

## 2014-04-24 DIAGNOSIS — R109 Unspecified abdominal pain: Secondary | ICD-10-CM | POA: Insufficient documentation

## 2014-04-24 DIAGNOSIS — R1084 Generalized abdominal pain: Secondary | ICD-10-CM | POA: Insufficient documentation

## 2014-04-25 ENCOUNTER — Encounter: Payer: Self-pay | Admitting: Neurology

## 2014-04-25 ENCOUNTER — Ambulatory Visit (INDEPENDENT_AMBULATORY_CARE_PROVIDER_SITE_OTHER): Payer: BC Managed Care – PPO | Admitting: Neurology

## 2014-04-25 VITALS — BP 132/82 | HR 79 | Ht 63.25 in | Wt 124.0 lb

## 2014-04-25 DIAGNOSIS — M5481 Occipital neuralgia: Secondary | ICD-10-CM

## 2014-04-25 DIAGNOSIS — M542 Cervicalgia: Secondary | ICD-10-CM | POA: Insufficient documentation

## 2014-04-25 DIAGNOSIS — M531 Cervicobrachial syndrome: Secondary | ICD-10-CM

## 2014-04-25 DIAGNOSIS — R079 Chest pain, unspecified: Secondary | ICD-10-CM

## 2014-04-25 DIAGNOSIS — R51 Headache: Secondary | ICD-10-CM

## 2014-04-25 DIAGNOSIS — M5010 Cervical disc disorder with radiculopathy, unspecified cervical region: Secondary | ICD-10-CM

## 2014-04-25 DIAGNOSIS — M5412 Radiculopathy, cervical region: Secondary | ICD-10-CM

## 2014-04-25 MED ORDER — NORTRIPTYLINE HCL 10 MG PO CAPS: 50.0000 mg | ORAL_CAPSULE | Freq: Every day | ORAL | Status: DC

## 2014-04-25 NOTE — Progress Notes (Signed)
[facility]    Provider:  Dr Lucia Gaskins Referring Provider: Gretel Acre, MD Primary Care Physician:  Gretel Acre, MD  CC:  headaches  HPI:  Nancy Boyer is a 28 y.o. female here as a referral from Dr. Ihor Dow for headaches  28 year old previous healthy female who developed headaches during the 8th month of pregnancy due to HTN 152/110. Headaches went away when baby was 71 months old. They returned, approx 2.5 months ago. The headaches are "all over", often with sharp pains in the occipital area. So severe she has to close eyes and go to a dark room. Can't move, can't have light or sounds. No nausea or vomiting. On the left side of jaw she sometimes gets numbness and tingling in the jaw. No lacrimation, injection, ptosis, rhinorrea. Headaches are daily. They last 2-3 hour spurts multiple times a day. Has tried tylenol 1g and doesn't help. Get up to 8/10. No triggers such as chewing, brushing, talking.Loud music may trigger them, it aggravates her. Often sharp shooting pains in the occipital area but sometimes it starts with throbbing behind the eyes but always spreads to the entire head. More throbbing than anything. Is not breastfeeding. Hasn't used anything else other than tylenol. Takes it 3x a week  3 times a day. Has neck pain. Headaches come any time of day. No association with positioning. Gets more headaches in the morning/afternoon. Baby is sleeping well. Has a husband at home. Feels the headaches are worse when she sleeps on the back of her head or her neck crunched up. Sometimes gets numbness in fingers.   Reviewed notes, labs and imaging from outside physicians, which showed pregnancy was complicated by pre-eclampsia in the 3rd trimester with persistent HTN up to 3 months after delivery. Was given a prednisone taper.  MRI of the brain was normal.   Review of Systems: Patient complains of symptoms per HPI as well as the following symptoms numbness,allergies.   Pertinent negatives per HPI. Otherwise out of a complete 14 system review, and all other reviewed systems are negative.   History   Social History  . Marital Status: Married    Spouse Name: N/A    Number of Children: N/A  . Years of Education: N/A   Occupational History  . MANAGER     [employer]   Social History Main Topics  . Smoking status: Never Smoker   . Smokeless tobacco: Never Used  . Alcohol Use: Yes     Comment: occ  . Drug Use: No  . Sexual Activity: Yes    Birth Control/ Protection: None   Other Topics Concern  . Not on file   Social History Narrative   Patient is married with 1 child,and works full time at [employer].    Patient is right handed.   Patient has a Bachelor's degree.   Patient drinks 1 cup every two weeks.    Family History  Problem Relation Age of Onset  . Prostate cancer Paternal Grandfather   . Hypertension Mother   . Hypertension Father   . Diabetes Paternal Grandmother   . Diabetes Maternal Grandmother   . Diabetes Paternal Grandfather     Past Medical History  Diagnosis Date  . Medical history non-contributory   . Migraine   . Allergic conjunctivitis and rhinitis   . Anxiety   . Allergy   . Abdominal pain   . Nausea & vomiting   . Blood in stool   . UTI (urinary  tract infection)   . Lactose intolerance   . Anal or rectal pain     Past Surgical History  Procedure Laterality Date  . Tonsillectomy  2010  . Wisdom tooth extraction  2003  . Cyst removal hand Left 2002    wrist    Current Outpatient Prescriptions  Medication Sig Dispense Refill  . acetaminophen (TYLENOL) 500 MG tablet Take 1,000 mg by mouth every 6 (six) hours as needed for mild pain or headache.       . Prenatal Vit-Fe Fumarate-FA (PRENATAL MULTIVITAMIN) TABS tablet Take 1 tablet by mouth daily at 12 noon.      . Probiotic Product (PROBIOTIC DAILY PO) Take 1 tablet by mouth daily.      . valACYclovir (VALTREX) 500 MG tablet Take 500 mg by mouth daily as needed  (cold sore outbreak).       . nortriptyline (PAMELOR) 10 MG capsule Take 5 capsules (50 mg total) by mouth at bedtime.  150 capsule  3   No current facility-administered medications for this visit.    Allergies as of 04/25/2014 - Review Complete 04/25/2014  Allergen Reaction Noted  . Penicillins Swelling and Rash 09/11/2013  . Codeine Other (See Comments) 09/11/2013    Vitals: BP 132/82  Pulse 79  Ht 5' 3.25" (1.607 m)  Wt 124 lb (56.246 kg)  BMI 21.78 kg/m2  Breastfeeding? No Last Weight:  Wt Readings from Last 1 Encounters:  04/25/14 124 lb (56.246 kg)   Last Height:   Ht Readings from Last 1 Encounters:  04/25/14 5' 3.25" (1.607 m)     Physical exam: Exam: Gen: NAD, conversant Eyes: anicteric sclerae, moist conjunctivae HENT: Atraumatic, oropharynx clear Neck: Trachea midline; supple,  Lungs: CTA, no wheezing, rales, rhonic                          CV: RRR, no MRG Abdomen: Soft, non-tender;  Extremities: No peripheral edema  Skin: Normal temperature, no rash,  Psych: Appropriate affect, pleasant  Neuro: Detailed Neurologic Exam  Speech:    Speech is normal; fluent and spontaneous with normal comprehension.  Cognition:    The patient is oriented to person, place, and time; memory intact; language fluent; normal attention, concentration, and fund of knowledge.  Cranial Nerves:    The pupils are equal, round, and reactive to light. The fundi are normal and spontaneous venous pulsations are present. Visual fields are full to finger confrontation. Extraocular movements are intact. Trigeminal sensation is intact and the muscles of mastication are normal. The face is symmetric. The palate elevates in the midline. Voice is normal. Shoulder shrug is normal. The tongue has normal motion without fasciculations.   Coordination:    Normal finger to nose and heel to shin. Normal rapid alternating movements.  Gait:    Heel-toe and tandem gait are normal.   Motor  Observation:    No asymmetry, no atrophy, and no involuntary movements noted. Tone:    Normal muscle tone.   Posture:    Posture is normal. normal erect    Strength:    Strength is V/V in the upper and lower limbs.    Light Touch:    Normal light touch sensation in upper and lower extremities.   occipital tenderness on palpation,      Reflex Exam:  DTR's:    Deep tendon reflexes in the upper and lower extremities are normal bilaterally.   Toes:    The toes are  downgoing bilaterally.   Clonus:    Clonus is absent.    Assessment:  28 year old previous healthy female who developed headaches during pregnancy complicated with pre-eclampsia. Headaches resolved when blood pressure was reduced. Headaches have returned and are worsening in frequency and severity. Can be severe.  Also complains of neck pain with some paresthesias in the fingers. Neuro exam is only significant with tenderness in the occipital region.   Headache has features of occipital neuralgia and possibly cervicalgia. Also has spread to her entire head with throbbing, pressure and some migrainous features:  1. Will perform an occipital nerve block 2. Will order an MRi of the cervical spine to rule out degenerative disease or other causes of cervicalgia 3. Will start Nortriptyline slowly. Discussed common side effects. EKG.    Discussed common triggers. To prevent or relieve headaches, discussed the following: Cool Compress. Lie down and place a cool compress on your head.  Avoid headache triggers. If certain foods or odors seem to have triggered your migraines in the past, avoid them. A headache diary might help you identify triggers.  Include physical activity in your daily routine. Try a daily walk or other moderate aerobic exercise.  Manage stress. Find healthy ways to cope with the stressors, such as delegating tasks on your to-do list.  Practice relaxation techniques. Try deep breathing, yoga, massage and  visualization.  Eat regularly. Eating regularly scheduled meals and maintaining a healthy diet might help prevent headaches. Also, drink plenty of fluids.  Follow a regular sleep schedule. Sleep deprivation might contribute to headaches Consider biofeedback. With this mind-body technique, you learn to control certain bodily functions - such as muscle tension, heart rate and blood pressure - to prevent headaches or reduce headache pain.    Proceed to emergency room if you experience new or worsening symptoms or symptoms do not resolve, if you have new neurologic symptoms or if headache is severe, or for any concerning symptom.    Naomie Dean, MD  [facility] [address] [place], [place] [address]  Phone [phone] Fax [phone]

## 2014-04-25 NOTE — Patient Instructions (Addendum)
Overall you are doing fairly well but I do want to suggest a few things today:   Remember to drink plenty of fluid, eat healthy meals and do not skip any meals. Try to eat protein with a every meal and eat a healthy snack such as fruit or nuts in between meals. Try to keep a regular sleep-wake schedule and try to exercise daily, particularly in the form of walking, 20-30 minutes a day, if you can.   As far as your medications are concerned, I would like to suggest nortriptyline  at night. Can increase by  every week. Most common symptoms include dry mouth, constipation, fatigue. Please get an EKG within 2 days of starting medication.  As far as diagnostic testing: MRI cervical spine, occipital nerve block  I would like to see you back in 3 months, sooner if we need to. Please call us with any interim questions, concerns, problems, updates or refill requests.   Please also call us for any test results so we can go over those with you on the phone.  My clinical assistant and will answer any of your questions and relay your messages to me and also relay most of my messages to you.   Our phone number is [phone]. We also have an after hours call service for urgent matters and there is a physician on-call for urgent questions. For any emergencies you know to call 911 or go to the nearest emergency room  To prevent or relieve headaches, try the following: Cool Compress. Lie down and place a cool compress on your head.  Avoid headache triggers. If certain foods or odors seem to have triggered your migraines in the past, avoid them. A headache diary might help you identify triggers.  Include physical activity in your daily routine. Try a daily walk or other moderate aerobic exercise.  Manage stress. Find healthy ways to cope with the stressors, such as delegating tasks on your to-do list.  Practice relaxation techniques. Try deep breathing, yoga, massage and visualization.  Eat regularly.  Eating regularly scheduled meals and maintaining a healthy diet might help prevent headaches. Also, drink plenty of fluids.  Follow a regular sleep schedule. Sleep deprivation might contribute to headaches Consider biofeedback. With this mind-body technique, you learn to control certain bodily functions - such as muscle tension, heart rate and blood pressure - to prevent headaches or reduce headache pain.    Proceed to emergency room if you experience new or worsening symptoms or symptoms do not resolve, if you have new neurologic symptoms or if headache is severe, or for any concerning symptom.

## 2014-04-26 ENCOUNTER — Other Ambulatory Visit: Payer: Self-pay | Admitting: Neurology

## 2014-04-26 DIAGNOSIS — M5481 Occipital neuralgia: Secondary | ICD-10-CM

## 2014-05-17 ENCOUNTER — Ambulatory Visit: Payer: BC Managed Care – PPO

## 2014-06-19 ENCOUNTER — Other Ambulatory Visit: Payer: Self-pay | Admitting: Internal Medicine

## 2014-06-19 DIAGNOSIS — R109 Unspecified abdominal pain: Secondary | ICD-10-CM

## 2014-06-21 ENCOUNTER — Ambulatory Visit
Admission: RE | Admit: 2014-06-21 | Discharge: 2014-06-21 | Disposition: A | Discharge status: Home / Self Care | Payer: BC Managed Care – PPO | Source: Ambulatory Visit | Attending: Internal Medicine | Admitting: Internal Medicine

## 2014-06-21 DIAGNOSIS — R109 Unspecified abdominal pain: Secondary | ICD-10-CM

## 2014-07-03 ENCOUNTER — Encounter: Payer: Self-pay | Admitting: Neurology

## 2014-07-26 ENCOUNTER — Ambulatory Visit: Payer: BC Managed Care – PPO | Admitting: Neurology

## 2014-10-23 ENCOUNTER — Other Ambulatory Visit: Payer: Self-pay | Admitting: Gastroenterology

## 2014-10-23 DIAGNOSIS — R1033 Periumbilical pain: Secondary | ICD-10-CM

## 2014-11-01 ENCOUNTER — Other Ambulatory Visit: Payer: Self-pay

## 2014-11-08 ENCOUNTER — Other Ambulatory Visit: Payer: Self-pay | Admitting: Gastroenterology

## 2014-11-08 ENCOUNTER — Ambulatory Visit
Admission: RE | Admit: 2014-11-08 | Discharge: 2014-11-08 | Disposition: A | Discharge status: Home / Self Care | Payer: BLUE CROSS/BLUE SHIELD | Source: Ambulatory Visit | Attending: Gastroenterology | Admitting: Gastroenterology

## 2014-11-08 DIAGNOSIS — R1033 Periumbilical pain: Secondary | ICD-10-CM

## 2014-11-13 ENCOUNTER — Ambulatory Visit
Admission: RE | Admit: 2014-11-13 | Discharge: 2014-11-13 | Disposition: A | Discharge status: Home / Self Care | Payer: BLUE CROSS/BLUE SHIELD | Source: Ambulatory Visit | Attending: Gastroenterology | Admitting: Gastroenterology

## 2014-11-13 DIAGNOSIS — R1033 Periumbilical pain: Secondary | ICD-10-CM

## 2015-01-22 ENCOUNTER — Other Ambulatory Visit: Payer: Self-pay | Admitting: Internal Medicine

## 2015-01-22 DIAGNOSIS — N2881 Hypertrophy of kidney: Secondary | ICD-10-CM

## 2015-02-07 ENCOUNTER — Ambulatory Visit
Admission: RE | Admit: 2015-02-07 | Discharge: 2015-02-07 | Disposition: A | Discharge status: Home / Self Care | Payer: BLUE CROSS/BLUE SHIELD | Source: Ambulatory Visit | Attending: Internal Medicine | Admitting: Internal Medicine

## 2015-02-07 DIAGNOSIS — N2881 Hypertrophy of kidney: Secondary | ICD-10-CM

## 2015-02-20 LAB — OB RESULTS CONSOLE ANTIBODY SCREEN: Antibody Screen: NEGATIVE

## 2015-02-20 LAB — OB RESULTS CONSOLE RUBELLA ANTIBODY, IGM: Rubella: IMMUNE

## 2015-02-20 LAB — OB RESULTS CONSOLE ABO/RH: RH Type: POSITIVE

## 2015-02-20 LAB — OB RESULTS CONSOLE HEPATITIS B SURFACE ANTIGEN: Hepatitis B Surface Ag: NEGATIVE

## 2015-02-20 LAB — OB RESULTS CONSOLE RPR: RPR: NONREACTIVE

## 2015-02-20 LAB — OB RESULTS CONSOLE HIV ANTIBODY (ROUTINE TESTING): HIV: NONREACTIVE

## 2015-09-02 NOTE — L&D Delivery Note (Signed)
Delivery Note At  a viable unspecified sex was delivered via Vaginal, Spontaneous Delivery (Presentation: ; Occiput Anterior).  APGAR: , ; weight  .   Placenta status: , .  Cord:  with the following complications: .  Cord pH: not sent Loose nuchal cord X 1, true knot noted Anesthesia: Epidural  Episiotomy:  none Lacerations:  none Suture Repair: NA Est. Blood Loss (mL):  250  Mom to postpartum.  Baby to Couplet care / Skin to Skin.  Nancy Boyer M 10/05/2015, 3:07 PM

## 2015-10-03 ENCOUNTER — Encounter (HOSPITAL_COMMUNITY): Payer: Self-pay | Admitting: *Deleted

## 2015-10-03 ENCOUNTER — Telehealth (HOSPITAL_COMMUNITY): Payer: Self-pay | Admitting: *Deleted

## 2015-10-03 LAB — OB RESULTS CONSOLE GBS: GBS: NEGATIVE

## 2015-10-03 NOTE — Telephone Encounter (Signed)
Preadmission screen  

## 2015-10-04 NOTE — H&P (Signed)
Nancy Boyer  DICTATION # [id number] CSN# [id number]   Meriel Pica, MD 10/04/2015 1:59 PM

## 2015-10-05 ENCOUNTER — Encounter (HOSPITAL_COMMUNITY): Payer: Self-pay

## 2015-10-05 ENCOUNTER — Inpatient Hospital Stay (HOSPITAL_COMMUNITY): Payer: BLUE CROSS/BLUE SHIELD | Admitting: Anesthesiology

## 2015-10-05 ENCOUNTER — Inpatient Hospital Stay (HOSPITAL_COMMUNITY)
Admission: RE | Admit: 2015-10-05 | Discharge: 2015-10-06 | DRG: 775 | Disposition: A | Discharge status: Home / Self Care | Payer: BLUE CROSS/BLUE SHIELD | Source: Ambulatory Visit | Attending: Obstetrics and Gynecology | Admitting: Obstetrics and Gynecology

## 2015-10-05 DIAGNOSIS — O164 Unspecified maternal hypertension, complicating childbirth: Secondary | ICD-10-CM | POA: Diagnosis present

## 2015-10-05 DIAGNOSIS — Z3A40 40 weeks gestation of pregnancy: Secondary | ICD-10-CM

## 2015-10-05 DIAGNOSIS — O480 Post-term pregnancy: Secondary | ICD-10-CM | POA: Diagnosis present

## 2015-10-05 DIAGNOSIS — Z3490 Encounter for supervision of normal pregnancy, unspecified, unspecified trimester: Secondary | ICD-10-CM

## 2015-10-05 LAB — COMPREHENSIVE METABOLIC PANEL
ALK PHOS: 181 U/L — AB (ref 38–126)
ALT: 13 U/L — ABNORMAL LOW (ref 14–54)
ANION GAP: 9 (ref 5–15)
AST: 24 U/L (ref 15–41)
Albumin: 2.7 g/dL — ABNORMAL LOW (ref 3.5–5.0)
BILIRUBIN TOTAL: 0.6 mg/dL (ref 0.3–1.2)
BUN: 7 mg/dL (ref 6–20)
CALCIUM: 8.4 mg/dL — AB (ref 8.9–10.3)
CO2: 22 mmol/L (ref 22–32)
Chloride: 103 mmol/L (ref 101–111)
Creatinine, Ser: 0.57 mg/dL (ref 0.44–1.00)
GFR calc non Af Amer: >60 mL/min (ref >60)
Glucose, Bld: 66 mg/dL (ref 65–99)
POTASSIUM: 4.2 mmol/L (ref 3.5–5.1)
Sodium: 134 mmol/L — ABNORMAL LOW (ref 135–145)
TOTAL PROTEIN: 5.7 g/dL — AB (ref 6.5–8.1)

## 2015-10-05 LAB — CBC
HEMATOCRIT: 35.8 % — AB (ref 36.0–46.0)
HEMATOCRIT: 34.6 % — AB (ref 36.0–46.0)
Hemoglobin: 12.3 g/dL (ref 12.0–15.0)
Hemoglobin: 11.8 g/dL — ABNORMAL LOW (ref 12.0–15.0)
MCH: 29.3 pg (ref 26.0–34.0)
MCH: 28.6 pg (ref 26.0–34.0)
MCHC: 34.4 g/dL (ref 30.0–36.0)
MCHC: 34.1 g/dL (ref 30.0–36.0)
MCV: 85.2 fL (ref 78.0–100.0)
MCV: 84 fL (ref 78.0–100.0)
PLATELETS: 238 10*3/uL (ref 150–400)
PLATELETS: 227 10*3/uL (ref 150–400)
RBC: 4.2 MIL/uL (ref 3.87–5.11)
RBC: 4.12 MIL/uL (ref 3.87–5.11)
RDW: 13.2 % (ref 11.5–15.5)
RDW: 13.2 % (ref 11.5–15.5)
WBC: 8.4 10*3/uL (ref 4.0–10.5)
WBC: 15.6 10*3/uL — AB (ref 4.0–10.5)

## 2015-10-05 LAB — ABO/RH: ABO/RH(D): B POS

## 2015-10-05 LAB — TYPE AND SCREEN
ABO/RH(D): B POS
Antibody Screen: NEGATIVE

## 2015-10-05 LAB — RPR: RPR Ser Ql: NONREACTIVE

## 2015-10-05 MED ORDER — ACETAMINOPHEN 325 MG PO TABS: 650.0000 mg | ORAL_TABLET | ORAL | Status: DC | PRN

## 2015-10-05 MED ORDER — DIPHENHYDRAMINE HCL 50 MG/ML IJ SOLN: 12.5000 mg | INTRAMUSCULAR | Status: DC | PRN

## 2015-10-05 MED ORDER — WITCH HAZEL-GLYCERIN EX PADS: 1.0000 "application " | MEDICATED_PAD | CUTANEOUS | Status: DC | PRN

## 2015-10-05 MED ORDER — OXYTOCIN 10 UNIT/ML IJ SOLN
1.0000 m[IU]/min | INTRAVENOUS | Status: DC
  Administered 2015-10-05: 2 m[IU]/min via INTRAVENOUS

## 2015-10-05 MED ORDER — FLEET ENEMA 7-19 GM/118ML RE ENEM: 1.0000 | ENEMA | RECTAL | Status: DC | PRN

## 2015-10-05 MED ORDER — MEASLES, MUMPS & RUBELLA VAC ~~LOC~~ INJ: 0.5000 mL | INJECTION | Freq: Once | SUBCUTANEOUS | Status: DC

## 2015-10-05 MED ORDER — ONDANSETRON HCL 4 MG PO TABS
4.0000 mg | ORAL_TABLET | ORAL | Status: DC | PRN
Start: 2015-10-05 — End: 2015-10-06

## 2015-10-05 MED ORDER — LACTATED RINGERS IV SOLN
INTRAVENOUS | Status: DC
  Administered 2015-10-05: 14:00:00 via INTRAVENOUS

## 2015-10-05 MED ORDER — DIPHENHYDRAMINE HCL 25 MG PO CAPS: 25.0000 mg | ORAL_CAPSULE | Freq: Four times a day (QID) | ORAL | Status: DC | PRN

## 2015-10-05 MED ORDER — TERBUTALINE SULFATE 1 MG/ML IJ SOLN
0.2500 mg | Freq: Once | INTRAMUSCULAR | Status: DC | PRN
  Filled 2015-10-05: qty 1

## 2015-10-05 MED ORDER — OXYCODONE-ACETAMINOPHEN 5-325 MG PO TABS: 2.0000 | ORAL_TABLET | ORAL | Status: DC | PRN

## 2015-10-05 MED ORDER — OXYTOCIN 10 UNIT/ML IJ SOLN
2.5000 [IU]/h | INTRAVENOUS | Status: DC
  Filled 2015-10-05: qty 4

## 2015-10-05 MED ORDER — SODIUM BICARBONATE 8.4 % IV SOLN
INTRAVENOUS | Status: DC | PRN
  Administered 2015-10-05: 5 mL via EPIDURAL

## 2015-10-05 MED ORDER — ONDANSETRON HCL 4 MG/2ML IJ SOLN: 4.0000 mg | Freq: Four times a day (QID) | INTRAMUSCULAR | Status: DC | PRN

## 2015-10-05 MED ORDER — EPHEDRINE 5 MG/ML INJ
10.0000 mg | INTRAVENOUS | Status: DC | PRN
  Filled 2015-10-05: qty 2

## 2015-10-05 MED ORDER — ONDANSETRON HCL 4 MG/2ML IJ SOLN: 4.0000 mg | INTRAMUSCULAR | Status: DC | PRN

## 2015-10-05 MED ORDER — CITRIC ACID-SODIUM CITRATE 334-500 MG/5ML PO SOLN: 30.0000 mL | ORAL | Status: DC | PRN

## 2015-10-05 MED ORDER — DIBUCAINE 1 % RE OINT: 1.0000 "application " | TOPICAL_OINTMENT | RECTAL | Status: DC | PRN

## 2015-10-05 MED ORDER — SENNOSIDES-DOCUSATE SODIUM 8.6-50 MG PO TABS
2.0000 | ORAL_TABLET | ORAL | Status: DC
  Filled 2015-10-05: qty 2

## 2015-10-05 MED ORDER — PRENATAL MULTIVITAMIN CH
1.0000 | ORAL_TABLET | Freq: Every day | ORAL | Status: DC
  Administered 2015-10-06: 1 via ORAL
  Filled 2015-10-05: qty 1

## 2015-10-05 MED ORDER — ZOLPIDEM TARTRATE 5 MG PO TABS: 5.0000 mg | ORAL_TABLET | Freq: Every evening | ORAL | Status: DC | PRN

## 2015-10-05 MED ORDER — SIMETHICONE 80 MG PO CHEW: 80.0000 mg | CHEWABLE_TABLET | ORAL | Status: DC | PRN

## 2015-10-05 MED ORDER — PHENYLEPHRINE 40 MCG/ML (10ML) SYRINGE FOR IV PUSH (FOR BLOOD PRESSURE SUPPORT)
80.0000 ug | PREFILLED_SYRINGE | INTRAVENOUS | Status: DC | PRN
  Filled 2015-10-05: qty 2

## 2015-10-05 MED ORDER — LACTATED RINGERS IV SOLN: 500.0000 mL | INTRAVENOUS | Status: DC | PRN

## 2015-10-05 MED ORDER — PHENYLEPHRINE 40 MCG/ML (10ML) SYRINGE FOR IV PUSH (FOR BLOOD PRESSURE SUPPORT)
80.0000 ug | PREFILLED_SYRINGE | INTRAVENOUS | Status: DC | PRN
  Filled 2015-10-05: qty 20
  Filled 2015-10-05: qty 2

## 2015-10-05 MED ORDER — OXYTOCIN BOLUS FROM INFUSION: 500.0000 mL | INTRAVENOUS | Status: DC

## 2015-10-05 MED ORDER — FENTANYL 2.5 MCG/ML BUPIVACAINE 1/10 % EPIDURAL INFUSION (WH - ANES): 14.0000 mL/h | INTRAMUSCULAR | Status: DC | PRN

## 2015-10-05 MED ORDER — IBUPROFEN 800 MG PO TABS
800.0000 mg | ORAL_TABLET | Freq: Three times a day (TID) | ORAL | Status: DC | PRN
  Administered 2015-10-05 – 2015-10-06 (×3): 800 mg via ORAL
  Filled 2015-10-05 (×3): qty 1

## 2015-10-05 MED ORDER — FLEET ENEMA 7-19 GM/118ML RE ENEM: 1.0000 | ENEMA | Freq: Every day | RECTAL | Status: DC | PRN

## 2015-10-05 MED ORDER — BENZOCAINE-MENTHOL 20-0.5 % EX AERO: 1.0000 "application " | INHALATION_SPRAY | CUTANEOUS | Status: DC | PRN

## 2015-10-05 MED ORDER — BISACODYL 10 MG RE SUPP: 10.0000 mg | Freq: Every day | RECTAL | Status: DC | PRN

## 2015-10-05 MED ORDER — OXYCODONE-ACETAMINOPHEN 5-325 MG PO TABS
1.0000 | ORAL_TABLET | ORAL | Status: DC | PRN
  Administered 2015-10-06: 1 via ORAL
  Filled 2015-10-05: qty 1

## 2015-10-05 MED ORDER — TETANUS-DIPHTH-ACELL PERTUSSIS 5-2.5-18.5 LF-MCG/0.5 IM SUSP: 0.5000 mL | Freq: Once | INTRAMUSCULAR | Status: DC

## 2015-10-05 MED ORDER — FENTANYL 2.5 MCG/ML BUPIVACAINE 1/10 % EPIDURAL INFUSION (WH - ANES)
14.0000 mL/h | INTRAMUSCULAR | Status: DC | PRN
  Administered 2015-10-05: 14 mL/h via EPIDURAL
  Filled 2015-10-05: qty 125

## 2015-10-05 MED ORDER — LIDOCAINE HCL (PF) 1 % IJ SOLN
INTRAMUSCULAR | Status: DC | PRN
  Administered 2015-10-05 (×2): 4 mL via EPIDURAL

## 2015-10-05 MED ORDER — LANOLIN HYDROUS EX OINT: TOPICAL_OINTMENT | CUTANEOUS | Status: DC | PRN

## 2015-10-05 MED ORDER — OXYCODONE-ACETAMINOPHEN 5-325 MG PO TABS
1.0000 | ORAL_TABLET | ORAL | Status: DC | PRN
  Administered 2015-10-06 (×3): 1 via ORAL
  Filled 2015-10-05 (×3): qty 1

## 2015-10-05 MED ORDER — LIDOCAINE HCL (PF) 1 % IJ SOLN
30.0000 mL | INTRAMUSCULAR | Status: DC | PRN
  Filled 2015-10-05: qty 30

## 2015-10-05 NOTE — H&P (Signed)
NAMEMarland Kitchen  ALEZANDRA, EGLI         ACCOUNT NO.:  [id number]  MEDICAL RECORD NO.:  [id number]  LOCATION:                                 FACILITY:  PHYSICIAN:  Duke Salvia. Marcelle Overlie, M.D.    DATE OF BIRTH:  DATE OF ADMISSION:  10/05/2015 DATE OF DISCHARGE:                             HISTORY & PHYSICAL   CHIEF COMPLAINT:  For labor induction at term.  HISTORY OF PRESENT ILLNESS:  A 30 year old, G2, P1-0-0-1 at term with a favorable cervix.  GBS negative.  Presents for AROM labor induction. She has had uneventful prenatal course.  Her 1 hour GTT was 80.  Blood type is B positive.  PAST MEDICAL HISTORY:  Please see the [facility] form for the remainder of her prenatal history.  ALLERGIES:  Penicillin and codeine.  PHYSICAL EXAMINATION:  VITAL SIGNS:  Temp 98.2, blood pressure 120/72. HEENT:  Unremarkable. NECK:  Supple without masses. LUNGS:  Clear. CARDIOVASCULAR:  Regular rate and rhythm without murmurs, rubs, gallops. BREASTS:  Not examined.  Term fundal height.  Fetal heart rate 140, cervix was 4, 75, vertex, -2. EXTREMITIES:  Same. NEUROLOGIC:  Same.  IMPRESSION:  Term pregnancy, favorable cervix for AROM induction protocol reviewed.     Lamel Mccarley M. Marcelle Overlie, M.D.     RM[facility]/MEDQ  D:  10/04/2015  T:  10/04/2015  Job:  [id number]

## 2015-10-05 NOTE — Anesthesia Preprocedure Evaluation (Signed)
Anesthesia Evaluation  Patient identified by MRN, date of birth, ID band Patient awake    Reviewed: Allergy & Precautions, H&P , NPO status , Patient's Chart, lab work & pertinent test results, reviewed documented beta blocker date and time   History of Anesthesia Complications (+) history of anesthetic complications  Airway Mallampati: II  TM Distance: >3 FB Neck ROM: full    Dental no notable dental hx. (+) Dental Advisory Given, Teeth Intact   Pulmonary neg pulmonary ROS,    Pulmonary exam normal breath sounds clear to auscultation       Cardiovascular Exercise Tolerance: Good hypertension, negative cardio ROS Normal cardiovascular exam Rhythm:regular Rate:Normal     Neuro/Psych negative neurological ROS  negative psych ROS   GI/Hepatic negative GI ROS, Neg liver ROS,   Endo/Other  negative endocrine ROS  Renal/GU negative Renal ROS  negative genitourinary   Musculoskeletal   Abdominal   Peds  Hematology negative hematology ROS (+)   Anesthesia Other Findings   Reproductive/Obstetrics History preeclampsia                             Anesthesia Physical Anesthesia Plan  ASA: III  Anesthesia Plan: Epidural   Post-op Pain Management:    Induction:   Airway Management Planned:   Additional Equipment:   Intra-op Plan:   Post-operative Plan:   Informed Consent: I have reviewed the patients History and Physical, chart, labs and discussed the procedure including the risks, benefits and alternatives for the proposed anesthesia with the patient or authorized representative who has indicated his/her understanding and acceptance.   Dental Advisory Given  Plan Discussed with: CRNA  Anesthesia Plan Comments:         Anesthesia Quick Evaluation

## 2015-10-05 NOTE — Progress Notes (Signed)
AROM>>clr AF, 3/50%

## 2015-10-05 NOTE — Anesthesia Procedure Notes (Signed)
Epidural Patient location during procedure: OB Start time: 10/05/2015 1:27 PM End time: 10/05/2015 1:32 PM  Staffing Anesthesiologist: Ronelle Nigh Performed by: anesthesiologist   Preanesthetic Checklist Completed: patient identified, site marked, surgical consent, pre-op evaluation, timeout performed, IV checked, risks and benefits discussed and monitors and equipment checked  Epidural Patient position: sitting Prep: site prepped and draped and DuraPrep Patient monitoring: continuous pulse ox and blood pressure Approach: midline Location: L3-L4 Injection technique: LOR saline  Needle:  Needle type: Tuohy  Needle gauge: 17 G Needle length: 9 cm and 9 Needle insertion depth: 5 cm cm Catheter type: closed end flexible Catheter size: 19 Gauge Catheter at skin depth: 10 cm Test dose: negative  Assessment Sensory level: T10 Events: blood not aspirated, injection not painful, no injection resistance, negative IV test and no paresthesia  Additional Notes Patient identified. Risks/Benefits/Options discussed with patient including but not limited to bleeding, infection, nerve damage, paralysis, failed block, incomplete pain control, headache, blood pressure changes, nausea, vomiting, reactions to medication both or allergic, itching and postpartum back pain. Confirmed with bedside nurse the patient's most recent platelet count. Confirmed with patient that they are not currently taking any anticoagulation, have any bleeding history or any family history of bleeding disorders. Patient expressed understanding and wished to proceed. All questions were answered. Sterile technique was used throughout the entire procedure. Please see nursing notes for vital signs. Test dose was given through epidural catheter and negative prior to continuing to dose epidural or start infusion. Warning signs of high block given to the patient including shortness of breath, tingling/numbness in hands, complete motor  block, or any concerning symptoms with instructions to call for help. Patient was given instructions on fall risk and not to get out of bed. All questions and concerns addressed with instructions to call with any issues or inadequate analgesia.

## 2015-10-05 NOTE — Lactation Note (Signed)
This note was copied from the chart of Nancy Boyer. Lactation Consultation Note  Patient Name: Nancy Boyer Today's Date: 10/05/2015 Reason for consult: Initial assessment Baby at 7 hr of life and mom reports bf is going well. Denies breast or nipple pain. Discussed baby behavior, feeding frequency, baby belly size, voids, wt loss, breast changes, and nipple care. When [facility] encouraged mom to call out for latch help, she stated that she knows how to bf and does not want any help. 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 Report given to RN.   Maternal Data Has patient been taught Hand Expression?: Yes Does the patient have breastfeeding experience prior to this delivery?: Yes  Feeding Feeding Type: Breast Fed  LATCH Score/Interventions Latch: Grasps breast easily, tongue down, lips flanged, rhythmical sucking.  Audible Swallowing: None Intervention(s): Skin to skin  Type of Nipple: Everted at rest and after stimulation  Comfort (Breast/Nipple): Soft / non-tender     Hold (Positioning): No assistance needed to correctly position infant at breast.  LATCH Score: 8  Lactation Tools Discussed/Used WIC Program: No   Consult Status Consult Status: Complete    Rulon Eisenmenger 10/05/2015, 10:21 PM

## 2015-10-06 LAB — CBC
HCT: 30.8 % — ABNORMAL LOW (ref 36.0–46.0)
Hemoglobin: 10.4 g/dL — ABNORMAL LOW (ref 12.0–15.0)
MCH: 28.6 pg (ref 26.0–34.0)
MCHC: 33.8 g/dL (ref 30.0–36.0)
MCV: 84.6 fL (ref 78.0–100.0)
PLATELETS: 203 10*3/uL (ref 150–400)
RBC: 3.64 MIL/uL — ABNORMAL LOW (ref 3.87–5.11)
RDW: 13.2 % (ref 11.5–15.5)
WBC: 11.7 10*3/uL — ABNORMAL HIGH (ref 4.0–10.5)

## 2015-10-06 MED ORDER — IBUPROFEN 800 MG PO TABS: 800.0000 mg | ORAL_TABLET | Freq: Three times a day (TID) | ORAL | Status: DC | PRN

## 2015-10-06 MED ORDER — OXYCODONE-ACETAMINOPHEN 5-325 MG PO TABS: 1.0000 | ORAL_TABLET | ORAL | Status: DC | PRN

## 2015-10-06 NOTE — Discharge Summary (Signed)
Obstetric Discharge Summary Reason for Admission: induction of labor Prenatal Procedures: none Intrapartum Procedures: spontaneous vaginal delivery Postpartum Procedures: none Complications-Operative and Postpartum: none HEMOGLOBIN  Date Value Ref Range Status  10/06/2015 10.4* 12.0 - 15.0 g/dL Final   HCT  Date Value Ref Range Status  10/06/2015 30.8* 36.0 - 46.0 % Final    Physical Exam:  General: alert Lochia: appropriate Uterine Fundus: firm Incision: healing well DVT Evaluation: No evidence of DVT seen on physical exam.  Discharge Diagnoses: Term Pregnancy-delivered  Discharge Information: Date: 10/06/2015 Activity: pelvic rest Diet: routine Medications: PNV, Ibuprofen and Percocet Condition: stable Instructions: refer to practice specific booklet Discharge to: home Follow-up Information    Follow up with Meriel Pica, MD. Schedule an appointment as soon as possible for a visit in 6 weeks.   Specialty:  Obstetrics and Gynecology   Contact information:   [address] [place] [address] [phone]       Newborn Data: Live born female  Birth Weight: 7 lb 1.8 oz (3226 g) APGAR: 8, 9  Home with mother.  Devarious Pavek M 10/06/2015, 9:22 AM

## 2015-10-06 NOTE — Anesthesia Postprocedure Evaluation (Signed)
Anesthesia Post Note  Patient: Nancy Boyer  Procedure(s) Performed: * No procedures listed *  Patient location during evaluation: Mother Baby Anesthesia Type: Epidural Level of consciousness: awake and alert Pain management: pain level controlled Vital Signs Assessment: post-procedure vital signs reviewed and stable Respiratory status: spontaneous breathing Cardiovascular status: stable Postop Assessment: no headache, no backache, no signs of nausea or vomiting and adequate PO intake Anesthetic complications: no    Last Vitals:  Filed Vitals:   10/05/15 2250 10/06/15 0526  BP: 130/87 135/84  Pulse: 68 65  Temp: 36.8 C 36.8 C  Resp: 20 18    Last Pain:  Filed Vitals:   10/06/15 0613  PainSc: 6                  Liah Morr Hristova

## 2015-10-06 NOTE — Progress Notes (Signed)
Acknowledge order for social work consult regarding hx of anxiety.  Met briefly with mother and explained reason for consult.   She was surprised and denied any hx of anxiety.  No social concerns noted or verbalized.  Full assessment was not completed and mother saw no reason for the intervention.

## 2016-01-29 ENCOUNTER — Other Ambulatory Visit: Payer: Self-pay | Admitting: Obstetrics and Gynecology

## 2016-01-29 DIAGNOSIS — N6001 Solitary cyst of right breast: Secondary | ICD-10-CM

## 2016-02-12 ENCOUNTER — Other Ambulatory Visit: Payer: BLUE CROSS/BLUE SHIELD

## 2016-02-14 ENCOUNTER — Ambulatory Visit
Admission: RE | Admit: 2016-02-14 | Discharge: 2016-02-14 | Disposition: A | Discharge status: Home / Self Care | Payer: BLUE CROSS/BLUE SHIELD | Source: Ambulatory Visit | Attending: Obstetrics and Gynecology | Admitting: Obstetrics and Gynecology

## 2016-02-14 ENCOUNTER — Other Ambulatory Visit: Payer: Self-pay | Admitting: Obstetrics and Gynecology

## 2016-02-14 DIAGNOSIS — N6310 Unspecified lump in the right breast, unspecified quadrant: Secondary | ICD-10-CM

## 2016-02-14 DIAGNOSIS — N6001 Solitary cyst of right breast: Secondary | ICD-10-CM

## 2016-02-18 ENCOUNTER — Other Ambulatory Visit: Payer: Self-pay | Admitting: Obstetrics and Gynecology

## 2016-02-18 DIAGNOSIS — N6310 Unspecified lump in the right breast, unspecified quadrant: Secondary | ICD-10-CM

## 2016-02-20 ENCOUNTER — Inpatient Hospital Stay: Admission: RE | Admit: 2016-02-20 | Payer: BLUE CROSS/BLUE SHIELD | Source: Ambulatory Visit

## 2016-02-20 ENCOUNTER — Other Ambulatory Visit: Payer: BLUE CROSS/BLUE SHIELD

## 2016-02-22 ENCOUNTER — Other Ambulatory Visit: Payer: BLUE CROSS/BLUE SHIELD

## 2016-02-28 ENCOUNTER — Ambulatory Visit
Admission: RE | Admit: 2016-02-28 | Discharge: 2016-02-28 | Disposition: A | Discharge status: Home / Self Care | Payer: BLUE CROSS/BLUE SHIELD | Source: Ambulatory Visit | Attending: Obstetrics and Gynecology | Admitting: Obstetrics and Gynecology

## 2016-02-28 DIAGNOSIS — N6310 Unspecified lump in the right breast, unspecified quadrant: Secondary | ICD-10-CM

## 2016-04-17 ENCOUNTER — Encounter (INDEPENDENT_AMBULATORY_CARE_PROVIDER_SITE_OTHER): Payer: Self-pay

## 2016-04-17 ENCOUNTER — Encounter: Payer: Self-pay | Admitting: Allergy

## 2016-04-17 ENCOUNTER — Ambulatory Visit (INDEPENDENT_AMBULATORY_CARE_PROVIDER_SITE_OTHER): Payer: BLUE CROSS/BLUE SHIELD | Admitting: Allergy

## 2016-04-17 VITALS — BP 118/74 | HR 74 | Temp 97.9°F | Resp 18 | Ht 63.39 in | Wt 123.6 lb

## 2016-04-17 DIAGNOSIS — L509 Urticaria, unspecified: Secondary | ICD-10-CM | POA: Diagnosis not present

## 2016-04-17 MED ORDER — RANITIDINE HCL 150 MG PO TABS: 150.0000 mg | ORAL_TABLET | Freq: Two times a day (BID) | ORAL | 3 refills | Status: DC

## 2016-04-17 NOTE — Progress Notes (Signed)
New Patient Note  RE: Nancy ShanksWhitney B Swinger MRN: [id number] DOB: 1985/11/03 Date of Office Visit: 04/17/2016  Referring provider: Gretel AcreNnodi, Adaku, MD Primary care provider: Gretel AcreNNODI, ADAKU, MD  Chief Complaint: rash  History of present illness: Nancy Boyer is a 30 y.o. female presenting today for consultation for rash.   She started with rash on neck and chest with progression to legs 5 days ago.  Rash was worse 5 days ago at onset and reports it fluctuates in severity.  Today rash is better.  Rash is itchy, raised, red bumps.  Unsure how long an individual bump will last before resolving. She states she has had rash somewhere; it has never completely resolved.  Has been using Aveeno oatmeal lotion.  Has not tried to take any medication.  She was previously using a face scrub that has walnut and corn as ingredients but did not have any rash on face where she uses the scrub.  She stopped using scrub.     Denies any new medication.   She had a uterine infection was prescribed clindamycin and finished the course a week or more ago. No stings.  No recent preceding illness.  Hot shower intensifies the rash.  Not worse at pressure points or with activity.   Denies swelling, fevers, joint pain.  No NSAID use.   She is not under any more stress than usual.  Has not changed her diet.     3-5 years ago saw [facility] for issues allergies with allergy testing positive cat and mold.    She did have PUPP with her most recent pregnancy.  Also reports having a "strawberry patch" on her neck with both pregnancies that resolved after delivery.    She is breastfeeding currently.   No history of asthma, eczema, food allergy (does have lactose intolerance).    Review of systems: Review of Systems  Constitutional: Negative for chills and fever.  HENT: Negative for sore throat.   Eyes: Negative for redness.  Respiratory: Negative for cough, shortness of breath and wheezing.   Cardiovascular:  Negative for chest pain.  Gastrointestinal: Negative for nausea and vomiting.  Neurological: Negative for headaches.    All other systems negative unless noted above in HPI  Past medical history: Past Medical History:  Diagnosis Date  . Abdominal pain   . Allergic conjunctivitis and rhinitis   . Allergy   . Anal or rectal pain   . Anxiety   . Blood in stool   . Lactose intolerance   . Medical history non-contributory   . Migraine   . Nausea & vomiting   . Pre-eclampsia   . Urticaria   . UTI (urinary tract infection)     Past surgical history: Past Surgical History:  Procedure Laterality Date  . CYST REMOVAL HAND Left 2002   wrist  . TONSILLECTOMY  2010  . WISDOM TOOTH EXTRACTION  2003    Family history:  Family History  Problem Relation Age of Onset  . Prostate cancer Paternal Grandfather   . Diabetes Paternal Grandfather   . Alzheimer's disease Paternal Grandfather   . Hypertension Mother   . Allergic rhinitis Mother   . Hypertension Father   . Diabetes Paternal Grandmother   . Diabetes Maternal Grandmother   . Stroke Maternal Grandmother   . Allergic rhinitis Sister   . Eczema Sister   . Asthma Sister   . Migraines Neg Hx   . Angioedema Neg Hx   . Immunodeficiency Neg Hx   .  Urticaria Neg Hx     Social history: Social History   Social History  . Marital status: Married   Social History Main Topics  . Smoking status: Never Smoker  . Smokeless tobacco: Never Used  . Alcohol use Yes     Comment: occ  . 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 Previously worked full time at [employer].    Patient has a Bachelor's degree.     Medication List:   Medication List  none   Known medication allergies: Allergies  Allergen Reactions  . Penicillins Swelling and Rash    Has patient had a PCN reaction causing immediate rash, facial/tongue/throat swelling, SOB or lightheadedness with  hypotension: Yes Has patient had a PCN reaction causing severe rash involving mucus membranes or skin necrosis: Yes Has patient had a PCN reaction that required hospitalization No Has patient had a PCN reaction occurring within the last 10 years: Yes If all of the above answers are "NO", then may proceed with Cephalosporin use.   . Codeine Nausea And Vomiting    Unknown childhood rxn     Physical examination: Blood pressure 118/74, pulse 74, temperature 97.9 F (36.6 C), temperature source Oral, resp. rate 18, height 5' 3.39" (1.61 m), weight 123 lb 9.6 oz (56.1 kg), SpO2 98 %, .  General: Alert, interactive, in no acute distress. HEENT: TMs pearly gray, turbinates minimally edematous without discharge, post-pharynx non erythematous. Neck: Supple without lymphadenopathy. Lungs: Clear to auscultation without wheezing, rhonchi or rales. {no increased work of breathing. CV: Normal S1, S2 without murmurs. Abdomen: Nondistended, nontender. Skin: Scattered erythematous urticarial type lesions primarily located neck and upper chest , nonvesicular. Extremities:  No clubbing, cyanosis or edema. Neuro:   Grossly intact.  Diagnositics/Labs: Physical urticaria testing positive with immediate erythema and flare with stroke of forearm using popsicle stick  Assessment and plan:   1. Urticaria Acute urticaria (<6 weeks duration).   No identifiable trigger at this time.    Monitor for fever, joint pains, swelling, bruising/purplish discoloration once hives have resolved.  Let us know if any of these occur.   Start Zyrtec 10mg  daily and Zantac 150mg  twice a day.   Discussed medication can pass into breastmilk.  She is planning to wean and stop breastfeeding soon.   If urticaria persist would increase to Zyrtec 10mg  twice day.   If becomes chronic, consider work-up.    Follow-up 2-3 mo   I appreciate the opportunity to take part in Nancy Boyer care. Please do not hesitate to contact me with  questions.  Sincerely,   Margo AyeShaylar Saga Balthazar, MD Allergy/Immunology [facility]

## 2016-04-17 NOTE — Patient Instructions (Signed)
1. Urticaria Acute urticaria (<6 weeks duration).   No identifiable trigger at this time.    Monitor for fever, joint pains, swelling, bruising/purplish discoloration once hives have resolved.  Let us know if any of these occur.   Start Zyrtec 10mg  daily and Zantac 150mg  twice a day.    If urticaria persist would increase to Zyrtec 10mg  twice day.    Follow-up 2-3 mo

## 2016-12-22 ENCOUNTER — Other Ambulatory Visit: Payer: Self-pay | Admitting: Obstetrics and Gynecology

## 2016-12-22 DIAGNOSIS — N6310 Unspecified lump in the right breast, unspecified quadrant: Secondary | ICD-10-CM

## 2016-12-24 ENCOUNTER — Ambulatory Visit
Admission: RE | Admit: 2016-12-24 | Discharge: 2016-12-24 | Disposition: A | Discharge status: Home / Self Care | Payer: BLUE CROSS/BLUE SHIELD | Source: Ambulatory Visit | Attending: Obstetrics and Gynecology | Admitting: Obstetrics and Gynecology

## 2016-12-24 DIAGNOSIS — N6310 Unspecified lump in the right breast, unspecified quadrant: Secondary | ICD-10-CM

## 2016-12-29 ENCOUNTER — Telehealth: Payer: Self-pay | Admitting: Allergy

## 2016-12-29 NOTE — Telephone Encounter (Signed)
Patient received bill just now for an office visit back in August after duductable was met. Please call pt back. THanks

## 2016-12-29 NOTE — Telephone Encounter (Signed)
Candice talked to pt & explained about Dr. Delorse Lek not being credentialed when she came in & we are refiling claims - however, I notice that the ins now says pt was not covered on that date - will call her back tomorrow

## 2016-12-30 NOTE — Telephone Encounter (Signed)
Left message to call me back - we need ins info for Aug 2017 -kt

## 2017-01-02 NOTE — Telephone Encounter (Signed)
Left another message to call be back with her insurance info - kt

## 2017-01-05 NOTE — Telephone Encounter (Signed)
Group # was wrong - corrected & resubmitted - kt

## 2017-05-22 DIAGNOSIS — N760 Acute vaginitis: Secondary | ICD-10-CM | POA: Diagnosis not present

## 2017-06-17 DIAGNOSIS — Z136 Encounter for screening for cardiovascular disorders: Secondary | ICD-10-CM | POA: Diagnosis not present

## 2017-06-17 DIAGNOSIS — Z23 Encounter for immunization: Secondary | ICD-10-CM | POA: Diagnosis not present

## 2017-06-17 DIAGNOSIS — Z13220 Encounter for screening for lipoid disorders: Secondary | ICD-10-CM | POA: Diagnosis not present

## 2017-06-17 DIAGNOSIS — Z713 Dietary counseling and surveillance: Secondary | ICD-10-CM | POA: Diagnosis not present

## 2017-06-17 DIAGNOSIS — Z131 Encounter for screening for diabetes mellitus: Secondary | ICD-10-CM | POA: Diagnosis not present

## 2017-06-25 DIAGNOSIS — B80 Enterobiasis: Secondary | ICD-10-CM | POA: Diagnosis not present

## 2017-07-16 DIAGNOSIS — J208 Acute bronchitis due to other specified organisms: Secondary | ICD-10-CM | POA: Diagnosis not present

## 2017-08-12 DIAGNOSIS — D485 Neoplasm of uncertain behavior of skin: Secondary | ICD-10-CM | POA: Diagnosis not present

## 2017-08-12 DIAGNOSIS — D225 Melanocytic nevi of trunk: Secondary | ICD-10-CM | POA: Diagnosis not present

## 2017-09-23 DIAGNOSIS — N760 Acute vaginitis: Secondary | ICD-10-CM | POA: Diagnosis not present

## 2017-10-01 ENCOUNTER — Ambulatory Visit (INDEPENDENT_AMBULATORY_CARE_PROVIDER_SITE_OTHER): Payer: BLUE CROSS/BLUE SHIELD | Admitting: Family Medicine

## 2017-10-01 ENCOUNTER — Encounter: Payer: Self-pay | Admitting: Family Medicine

## 2017-10-01 VITALS — BP 110/74 | HR 88 | Temp 98.0°F | Resp 16 | Ht 63.3 in | Wt 125.0 lb

## 2017-10-01 DIAGNOSIS — R091 Pleurisy: Secondary | ICD-10-CM

## 2017-10-01 DIAGNOSIS — J101 Influenza due to other identified influenza virus with other respiratory manifestations: Secondary | ICD-10-CM

## 2017-10-01 DIAGNOSIS — R6889 Other general symptoms and signs: Secondary | ICD-10-CM

## 2017-10-01 LAB — POCT INFLUENZA A/B
INFLUENZA B, POC: NEGATIVE
Influenza A, POC: POSITIVE — AB

## 2017-10-01 MED ORDER — HYDROCODONE-HOMATROPINE 5-1.5 MG/5ML PO SYRP: 5.0000 mL | ORAL_SOLUTION | Freq: Four times a day (QID) | ORAL | 0 refills | Status: DC | PRN

## 2017-10-01 MED ORDER — AZITHROMYCIN 250 MG PO TABS: ORAL_TABLET | ORAL | 0 refills | Status: DC

## 2017-10-01 MED ORDER — OSELTAMIVIR PHOSPHATE 75 MG PO CAPS: 75.0000 mg | ORAL_CAPSULE | Freq: Two times a day (BID) | ORAL | 0 refills | Status: DC

## 2017-10-01 NOTE — Progress Notes (Signed)
Subjective:    Patient ID: Nancy Boyer, female    DOB: 08/08/1986, 32 y.o.   MRN: [id number] Chief Complaint  Patient presents with  . Cough    48 hours, productive  . Fatigue    Child has bronchitis, other child has flu per pt    HPI  Nancy Boyer is a delightful 41 yo mother of 2 and 78 yo little girls who have both been ill for the past 3 months with recurrent URI type infections.  Currently her youngest is finishing Augmentin for a course of bronchitis but is still not all the way better so has a pediatrician visit later this afternoon to likely start a second round of antibiotics.  But her oldest daughter was diagnosed with influenza type A about 5d ago.  2d ago she started feeling slightly off with some myalgias and chills and a little cough but quickly worsening - yesterday felt like she had been hit by a truck and woke up today with fevers.  Cough is productive of thick green mucous and is excruciatingly painful and she is having some SHoB she is cough so much. Extremely fatigued.  Whole anterior chest just kills her. Today her youngest woke up saying she was feeling much worse with nausea and muscle aches as well as worried she now also as the flu.  Past Medical History:  Diagnosis Date  . Abdominal pain   . Allergic conjunctivitis and rhinitis   . Allergy   . Anal or rectal pain   . Anxiety   . Blood in stool   . Lactose intolerance   . Medical history non-contributory   . Migraine   . Nausea & vomiting   . Pre-eclampsia   . Urticaria   . UTI (urinary tract infection)    Past Surgical History:  Procedure Laterality Date  . BREAST BIOPSY Right   . CYST REMOVAL HAND Left 2002   wrist  . TONSILLECTOMY  2010  . WISDOM TOOTH EXTRACTION  2003   Current Outpatient Medications on File Prior to Visit  Medication Sig Dispense Refill  . ranitidine (ZANTAC) 150 MG tablet Take 1 tablet (150 mg total) by mouth 2 (two) times daily. (Patient not taking: Reported on 10/01/2017)  60 tablet 3  . valACYclovir (VALTREX) 500 MG tablet      No current facility-administered medications on file prior to visit.    Allergies  Allergen Reactions  . Penicillins Swelling and Rash    Has patient had a PCN reaction causing immediate rash, facial/tongue/throat swelling, SOB or lightheadedness with hypotension: Yes Has patient had a PCN reaction causing severe rash involving mucus membranes or skin necrosis: Yes Has patient had a PCN reaction that required hospitalization No Has patient had a PCN reaction occurring within the last 10 years: Yes If all of the above answers are "NO", then may proceed with Cephalosporin use.   . Codeine Nausea And Vomiting    Unknown childhood rxn   Family History  Problem Relation Age of Onset  . Prostate cancer Paternal Grandfather   . Diabetes Paternal Grandfather   . Alzheimer's disease Paternal Grandfather   . Hypertension Mother   . Allergic rhinitis Mother   . Hypertension Father   . Diabetes Paternal Grandmother   . Diabetes Maternal Grandmother   . Stroke Maternal Grandmother   . Allergic rhinitis Sister   . Eczema Sister   . Asthma Sister   . Migraines Neg Hx   . Angioedema Neg Hx   .  Immunodeficiency Neg Hx   . Urticaria Neg Hx    Social History   Socioeconomic History  . Marital status: Married    Spouse name: None  . Number of children: None  . Years of education: None  . Highest education level: None  Social Needs  . Financial resource strain: None  . Food insecurity - worry: None  . Food insecurity - inability: None  . Transportation needs - medical: None  . Transportation needs - non-medical: None  Occupational History  . Occupation: Event organiserMANAGER    Employer: [employer]    Comment: [employer]  Tobacco Use  . Smoking status: Never Smoker  . Smokeless tobacco: Never Used  Substance and Sexual Activity  . Alcohol use: Yes    Comment: occ  . Drug use: No  . Sexual activity: Yes    Birth control/protection:  None  Other Topics Concern  . None  Social History Narrative   Patient is married with 1 child,and works full time at [employer].    Patient is right handed.   Patient has a Bachelor's degree.   Patient drinks 1 cup every two weeks.   [facility] 10/01/2017  Decreased Interest 0  Down, Depressed, Hopeless 0  PHQ - 2 Score 0     Review of Systems  Constitutional: Positive for activity change, appetite change, chills, diaphoresis, fatigue and fever.  HENT: Positive for congestion, postnasal drip, sore throat and voice change. Negative for ear discharge, ear pain, mouth sores, nosebleeds, rhinorrhea, sinus pressure, sneezing and trouble swallowing.   Eyes: Negative for pain.  Respiratory: Positive for cough, chest tightness and shortness of breath (when coughing). Negative for wheezing.   Cardiovascular: Positive for chest pain (with cough). Negative for palpitations and leg swelling.  Gastrointestinal: Positive for nausea. Negative for abdominal pain and vomiting.  Genitourinary: Negative for dysuria.  Musculoskeletal: Positive for back pain and myalgias. Negative for arthralgias, gait problem, joint swelling, neck pain and neck stiffness.  Skin: Negative for rash.  Neurological: Positive for weakness, light-headedness and headaches. Negative for dizziness and syncope.  Hematological: Positive for adenopathy.  Psychiatric/Behavioral: Positive for sleep disturbance.       Objective:   Physical Exam  Constitutional: She is oriented to person, place, and time. She appears well-developed and well-nourished. She appears ill. No distress.  HENT:  Head: Normocephalic and atraumatic.  Right Ear: Tympanic membrane, external ear and ear canal normal.  Left Ear: Tympanic membrane, external ear and ear canal normal.  Nose: Nose normal. No mucosal edema or rhinorrhea.  Mouth/Throat: Uvula is midline and mucous membranes are normal. Posterior oropharyngeal erythema present. No  oropharyngeal exudate or posterior oropharyngeal edema.  Erythema of nasal mucosa  Eyes: Conjunctivae are normal. Right eye exhibits no discharge. Left eye exhibits no discharge. No scleral icterus.  Neck: Normal range of motion. Neck supple.  Cardiovascular: Regular rhythm, normal heart sounds and intact distal pulses. Tachycardia present.  Pulmonary/Chest: Effort normal. She has no decreased breath sounds. She has rhonchi in the right lower field. She has rales in the right lower field.  Lymphadenopathy:       Head (right side): Submandibular and tonsillar adenopathy present.       Head (left side): Submandibular and tonsillar adenopathy present.    She has no cervical adenopathy.       Right: No supraclavicular adenopathy present.       Left: No supraclavicular adenopathy present.  Neurological: She is alert and oriented to person, place, and  time.  Skin: Skin is warm and dry. She is not diaphoretic. No erythema.  Psychiatric: She has a normal mood and affect. Her behavior is normal.      BP 110/74   Pulse 88   Temp 98 F (36.7 C) (Oral)   Resp 16   Ht 5' 3.3" (1.608 m)   Wt 125 lb (56.7 kg)   LMP 09/19/2017   SpO2 98%   BMI 21.93 kg/m   Results for orders placed or performed in visit on 10/01/17  POCT Influenza A/B  Result Value Ref Range   Influenza A, POC Positive (A) Negative   Influenza B, POC Negative Negative    Assessment & Plan:  Is establishing care here as well today - states I am her PCP now.  1. Influenza A   2. Flu-like symptoms   3. Pleurisy   As daughter currently has broncho-pneumonia on Augmentin x 1 wk an pt has focal lung findings, I feel safest covering her with an antibiotic for concommitant bacterial PNA as well. If not improving in 3-4d, RTC.  Rest, hydrate. Offered prednisone to help with symptomatic improvement with pleurisy but pt declines due to side effects - will keep otc ibuprofen 800mg  q8hrs sched on board for next 2-3d.  Orders Placed  This Encounter  Procedures  . POCT Influenza A/B    Meds ordered this encounter  Medications  . oseltamivir (TAMIFLU) 75 MG capsule    Sig: Take 1 capsule (75 mg total) by mouth 2 (two) times daily.    Dispense:  10 capsule    Refill:  0  . azithromycin (ZITHROMAX) 250 MG tablet    Sig: Take 2 tabs PO x 1 dose, then 1 tab PO QD x 4 days    Dispense:  6 tablet    Refill:  0  . HYDROcodone-homatropine (HYCODAN) 5-1.5 MG/5ML syrup    Sig: Take 5 mLs by mouth every 6 (six) hours as needed for cough (and chest pain).    Dispense:  120 mL    Refill:  0      Norberto Sorenson, M.D.  Primary Care at [facility] [address] [place], [place] [address] [phone] phone [phone] fax  10/01/17 1:20 PM

## 2017-10-01 NOTE — Patient Instructions (Addendum)
   IF you received an x-ray today, you will receive an invoice from [facility]. Please contact [facility] at [phone] with questions or concerns regarding your invoice.   IF you received labwork today, you will receive an invoice from [facility]. Please contact [facility] at [phone] with questions or concerns regarding your invoice.   Our billing staff will not be able to assist you with questions regarding bills from these companies.  You will be contacted with the lab results as soon as they are available. The fastest way to get your results is to activate your My Chart account. Instructions are located on the last page of this paperwork. If you have not heard from us regarding the results in 2 weeks, please contact this office.      Influenza, Adult Influenza, more commonly known as "the flu," is a viral infection that primarily affects the respiratory tract. The respiratory tract includes organs that help you breathe, such as the lungs, nose, and throat. The flu causes many common cold symptoms, as well as a high fever and body aches. The flu spreads easily from person to person (is contagious). Getting a flu shot (influenza vaccination) every year is the best way to prevent influenza. What are the causes? Influenza is caused by a virus. You can catch the virus by:  Breathing in droplets from an infected person's cough or sneeze.  Touching something that was recently contaminated with the virus and then touching your mouth, nose, or eyes.  What increases the risk? The following factors may make you more likely to get the flu:  Not cleaning your hands frequently with soap and water or alcohol-based hand sanitizer.  Having close contact with many people during cold and flu season.  Touching your mouth, eyes, or nose without washing or sanitizing your hands first.  Not drinking enough fluids or not eating a healthy diet.  Not getting enough sleep or  exercise.  Being under a high amount of stress.  Not getting a yearly (annual) flu shot.  You may be at a higher risk of complications from the flu, such as a severe lung infection (pneumonia), if you:  Are over the age of 65.  Are pregnant.  Have a weakened disease-fighting system (immune system). You may have a weakened immune system if you: ? Have HIV or AIDS. ? Are undergoing chemotherapy. ? Aretaking medicines that reduce the activity of (suppress) the immune system.  Have a long-term (chronic) illness, such as heart disease, kidney disease, diabetes, or lung disease.  Have a liver disorder.  Are obese.  Have anemia.  What are the signs or symptoms? Symptoms of this condition typically last 4-10 days and may include:  Fever.  Chills.  Headache, body aches, or muscle aches.  Sore throat.  Cough.  Runny or congested nose.  Chest discomfort and cough.  Poor appetite.  Weakness or tiredness (fatigue).  Dizziness.  Nausea or vomiting.  How is this diagnosed? This condition may be diagnosed based on your medical history and a physical exam. Your health care provider may do a nose or throat swab test to confirm the diagnosis. How is this treated? If influenza is detected early, you can be treated with antiviral medicine that can reduce the length of your illness and the severity of your symptoms. This medicine may be given by mouth (orally) or through an IV tube that is inserted in one of your veins. The goal of treatment is to relieve symptoms by taking   care of yourself at home. This may include taking over-the-counter medicines, drinking plenty of fluids, and adding humidity to the air in your home. In some cases, influenza goes away on its own. Severe influenza or complications from influenza may be treated in a hospital. Follow these instructions at home:  Take over-the-counter and prescription medicines only as told by your health care provider.  Use a  cool mist humidifier to add humidity to the air in your home. This can make breathing easier.  Rest as needed.  Drink enough fluid to keep your urine clear or pale yellow.  Cover your mouth and nose when you cough or sneeze.  Wash your hands with soap and water often, especially after you cough or sneeze. If soap and water are not available, use hand sanitizer.  Stay home from work or school as told by your health care provider. Unless you are visiting your health care provider, try to avoid leaving home until your fever has been gone for 24 hours without the use of medicine.  Keep all follow-up visits as told by your health care provider. This is important. How is this prevented?  Getting an annual flu shot is the best way to avoid getting the flu. You may get the flu shot in late summer, fall, or winter. Ask your health care provider when you should get your flu shot.  Wash your hands often or use hand sanitizer often.  Avoid contact with people who are sick during cold and flu season.  Eat a healthy diet, drink plenty of fluids, get enough sleep, and exercise regularly. Contact a health care provider if:  You develop new symptoms.  You have: ? Chest pain. ? Diarrhea. ? A fever.  Your cough gets worse.  You produce more mucus.  You feel nauseous or you vomit. Get help right away if:  You develop shortness of breath or difficulty breathing.  Your skin or nails turn a bluish color.  You have severe pain or stiffness in your neck.  You develop a sudden headache or sudden pain in your face or ear.  You cannot stop vomiting. This information is not intended to replace advice given to you by your health care provider. Make sure you discuss any questions you have with your health care provider. Document Released: 08/15/2000 Document Revised: 01/24/2016 Document Reviewed: 06/12/2015 Elsevier Interactive Patient Education  2017 [employer]  

## 2017-10-04 ENCOUNTER — Encounter: Payer: Self-pay | Admitting: Family Medicine

## 2017-10-07 DIAGNOSIS — N760 Acute vaginitis: Secondary | ICD-10-CM | POA: Diagnosis not present

## 2017-10-07 DIAGNOSIS — N390 Urinary tract infection, site not specified: Secondary | ICD-10-CM | POA: Diagnosis not present

## 2017-11-16 DIAGNOSIS — H01009 Unspecified blepharitis unspecified eye, unspecified eyelid: Secondary | ICD-10-CM | POA: Diagnosis not present

## 2017-12-10 DIAGNOSIS — R238 Other skin changes: Secondary | ICD-10-CM | POA: Diagnosis not present

## 2018-02-04 ENCOUNTER — Other Ambulatory Visit: Payer: Self-pay

## 2018-02-04 ENCOUNTER — Ambulatory Visit (INDEPENDENT_AMBULATORY_CARE_PROVIDER_SITE_OTHER): Payer: BLUE CROSS/BLUE SHIELD | Admitting: Emergency Medicine

## 2018-02-04 ENCOUNTER — Encounter: Payer: Self-pay | Admitting: Emergency Medicine

## 2018-02-04 VITALS — BP 106/66 | HR 77 | Temp 98.0°F | Resp 16 | Ht 63.75 in | Wt 122.2 lb

## 2018-02-04 DIAGNOSIS — R519 Headache, unspecified: Secondary | ICD-10-CM | POA: Insufficient documentation

## 2018-02-04 DIAGNOSIS — R51 Headache: Secondary | ICD-10-CM | POA: Diagnosis not present

## 2018-02-04 DIAGNOSIS — B349 Viral infection, unspecified: Secondary | ICD-10-CM

## 2018-02-04 DIAGNOSIS — R197 Diarrhea, unspecified: Secondary | ICD-10-CM

## 2018-02-04 LAB — CBC WITH DIFFERENTIAL/PLATELET
BASOS ABS: 0 10*3/uL (ref 0.0–0.2)
Basos: 0 %
EOS (ABSOLUTE): 0.1 10*3/uL (ref 0.0–0.4)
Eos: 2 %
HEMOGLOBIN: 12.6 g/dL (ref 11.1–15.9)
Hematocrit: 38.2 % (ref 34.0–46.6)
IMMATURE GRANS (ABS): 0 10*3/uL (ref 0.0–0.1)
IMMATURE GRANULOCYTES: 0 %
LYMPHS: 26 %
Lymphocytes Absolute: 1.5 10*3/uL (ref 0.7–3.1)
MCH: 29.4 pg (ref 26.6–33.0)
MCHC: 33 g/dL (ref 31.5–35.7)
MCV: 89 fL (ref 79–97)
MONOCYTES: 8 %
Monocytes Absolute: 0.4 10*3/uL (ref 0.1–0.9)
NEUTROS PCT: 64 %
Neutrophils Absolute: 3.6 10*3/uL (ref 1.4–7.0)
Platelets: 271 10*3/uL (ref 150–450)
RBC: 4.29 x10E6/uL (ref 3.77–5.28)
RDW: 12.8 % (ref 12.3–15.4)
WBC: 5.6 10*3/uL (ref 3.4–10.8)

## 2018-02-04 LAB — COMPREHENSIVE METABOLIC PANEL
ALBUMIN: 4.6 g/dL (ref 3.5–5.5)
ALK PHOS: 55 IU/L (ref 39–117)
ALT: 14 IU/L (ref 0–32)
AST: 21 IU/L (ref 0–40)
Albumin/Globulin Ratio: 1.8 (ref 1.2–2.2)
BUN / CREAT RATIO: 14 (ref 9–23)
BUN: 10 mg/dL (ref 6–20)
Bilirubin Total: 0.6 mg/dL (ref 0.0–1.2)
CO2: 24 mmol/L (ref 20–29)
Calcium: 9.7 mg/dL (ref 8.7–10.2)
Chloride: 103 mmol/L (ref 96–106)
Creatinine, Ser: 0.69 mg/dL (ref 0.57–1.00)
GFR calc Af Amer: 133 mL/min/{1.73_m2} (ref >59)
GFR calc non Af Amer: 116 mL/min/{1.73_m2} (ref >59)
Globulin, Total: 2.5 g/dL (ref 1.5–4.5)
Glucose: 88 mg/dL (ref 65–99)
Potassium: 4.8 mmol/L (ref 3.5–5.2)
Sodium: 141 mmol/L (ref 134–144)
TOTAL PROTEIN: 7.1 g/dL (ref 6.0–8.5)

## 2018-02-04 NOTE — Patient Instructions (Addendum)
   IF you received an x-ray today, you will receive an invoice from [facility]. Please contact [facility] at [phone] with questions or concerns regarding your invoice.   IF you received labwork today, you will receive an invoice from [facility]. Please contact [facility] at [phone] with questions or concerns regarding your invoice.   Our billing staff will not be able to assist you with questions regarding bills from these companies.  You will be contacted with the lab results as soon as they are available. The fastest way to get your results is to activate your My Chart account. Instructions are located on the last page of this paperwork. If you have not heard from us regarding the results in 2 weeks, please contact this office.     Viral Illness, Adult Viruses are tiny germs that can get into a person's body and cause illness. There are many different types of viruses, and they cause many types of illness. Viral illnesses can range from mild to severe. They can affect various parts of the body. Common illnesses that are caused by a virus include colds and the flu. Viral illnesses also include serious conditions such as HIV/AIDS (human immunodeficiency virus/acquired immunodeficiency syndrome). A few viruses have been linked to certain cancers. What are the causes? Many types of viruses can cause illness. Viruses invade cells in your body, multiply, and cause the infected cells to malfunction or die. When the cell dies, it releases more of the virus. When this happens, you develop symptoms of the illness, and the virus continues to spread to other cells. If the virus takes over the function of the cell, it can cause the cell to divide and grow out of control, as is the case when a virus causes cancer. Different viruses get into the body in different ways. You can get a virus by:  Swallowing food or water that is contaminated with the virus.  Breathing in droplets  that have been coughed or sneezed into the air by an infected person.  Touching a surface that has been contaminated with the virus and then touching your eyes, nose, or mouth.  Being bitten by an insect or animal that carries the virus.  Having sexual contact with a person who is infected with the virus.  Being exposed to blood or fluids that contain the virus, either through an open cut or during a transfusion.  If a virus enters your body, your body's defense system (immune system) will try to fight the virus. You may be at higher risk for a viral illness if your immune system is weak. What are the signs or symptoms? Symptoms vary depending on the type of virus and the location of the cells that it invades. Common symptoms of the main types of viral illnesses include: Cold and flu viruses  Fever.  Headache.  Sore throat.  Muscle aches.  Nasal congestion.  Cough. Digestive system (gastrointestinal) viruses  Fever.  Abdominal pain.  Nausea.  Diarrhea. Liver viruses (hepatitis)  Loss of appetite.  Tiredness.  Yellowing of the skin (jaundice). Brain and spinal cord viruses  Fever.  Headache.  Stiff neck.  Nausea and vomiting.  Confusion or sleepiness. Skin viruses  Warts.  Itching.  Rash. Sexually transmitted viruses  Discharge.  Swelling.  Redness.  Rash. How is this treated? Viruses can be difficult to treat because they live within cells. Antibiotic medicines do not treat viruses because these drugs do not get inside cells. Treatment for a viral illness   may include:  Resting and drinking plenty of fluids.  Medicines to relieve symptoms. These can include over-the-counter medicine for pain and fever, medicines for cough or congestion, and medicines to relieve diarrhea.  Antiviral medicines. These drugs are available only for certain types of viruses. They may help reduce flu symptoms if taken early. There are also many antiviral medicines  for hepatitis and HIV/AIDS.  Some viral illnesses can be prevented with vaccinations. A common example is the flu shot. Follow these instructions at home: Medicines   Take over-the-counter and prescription medicines only as told by your health care provider.  If you were prescribed an antiviral medicine, take it as told by your health care provider. Do not stop taking the medicine even if you start to feel better.  Be aware of when antibiotics are needed and when they are not needed. Antibiotics do not treat viruses. If your health care provider thinks that you may have a bacterial infection as well as a viral infection, you may get an antibiotic. ? Do not ask for an antibiotic prescription if you have been diagnosed with a viral illness. That will not make your illness go away faster. ? Frequently taking antibiotics when they are not needed can lead to antibiotic resistance. When this develops, the medicine no longer works against the bacteria that it normally fights. General instructions  Drink enough fluids to keep your urine clear or pale yellow.  Rest as much as possible.  Return to your normal activities as told by your health care provider. Ask your health care provider what activities are safe for you.  Keep all follow-up visits as told by your health care provider. This is important. How is this prevented? Take these actions to reduce your risk of viral infection:  Eat a healthy diet and get enough rest.  Wash your hands often with soap and water. This is especially important when you are in public places. If soap and water are not available, use hand sanitizer.  Avoid close contact with friends and family who have a viral illness.  If you travel to areas where viral gastrointestinal infection is common, avoid drinking water or eating raw food.  Keep your immunizations up to date. Get a flu shot every year as told by your health care provider.  Do not share toothbrushes,  nail clippers, razors, or needles with other people.  Always practice safe sex.  Contact a health care provider if:  You have symptoms of a viral illness that do not go away.  Your symptoms come back after going away.  Your symptoms get worse. Get help right away if:  You have trouble breathing.  You have a severe headache or a stiff neck.  You have severe vomiting or abdominal pain. This information is not intended to replace advice given to you by your health care provider. Make sure you discuss any questions you have with your health care provider. Document Released: 12/28/2015 Document Revised: 01/30/2016 Document Reviewed: 12/28/2015 Elsevier Interactive Patient Education  2018 Elsevier Inc.  

## 2018-02-04 NOTE — Progress Notes (Signed)
Nancy Boyer 32 y.o.   Chief Complaint  Patient presents with  . Diarrhea    x 5 days for 4-5 times per day and insect bites on body   . Headache    HISTORY OF PRESENT ILLNESS: This is a 32 y.o. female got sick on Sunday evening.  The day before she spent with the family outdoors and swam [place].  Developed a headache with constant watery diarrhea, nausea, and chills the day after.  Had some diffuse abdominal cramping.  Denies fever or vomiting.  No blood in the stools.  Today feels better the diarrhea is pretty much gone but still has a headache.  83-year-old daughter got sick last night with fever and chills, some diarrhea today.  Husband not sick but has been complaining of lethargy for the past couple weeks.  43-year-old daughter doing well.  Denies any other significant symptoms except for multiple mosquito bites on different places.  HPI   Prior to Admission medications   Medication Sig Start Date End Date Taking? Authorizing Provider  valACYclovir (VALTREX) 500 MG tablet  09/10/17  Yes [provider]  azithromycin (ZITHROMAX) 250 MG tablet Take 2 tabs PO x 1 dose, then 1 tab PO QD x 4 days Patient not taking: Reported on 02/04/2018 10/01/17   Sherren Mocha, MD  HYDROcodone-homatropine [facility]) 5-1.5 MG/5ML syrup Take 5 mLs by mouth every 6 (six) hours as needed for cough (and chest pain). Patient not taking: Reported on 02/04/2018 10/01/17   Sherren Mocha, MD  oseltamivir (TAMIFLU) 75 MG capsule Take 1 capsule (75 mg total) by mouth 2 (two) times daily. Patient not taking: Reported on 02/04/2018 10/01/17   Sherren Mocha, MD  ranitidine (ZANTAC) 150 MG tablet Take 1 tablet (150 mg total) by mouth 2 (two) times daily. Patient not taking: Reported on 10/01/2017 04/17/16   Marcelyn Bruins, MD    Allergies  Allergen Reactions  . Penicillins Swelling and Rash    Has patient had a PCN reaction causing immediate rash, facial/tongue/throat swelling, SOB or lightheadedness  with hypotension: Yes Has patient had a PCN reaction causing severe rash involving mucus membranes or skin necrosis: Yes Has patient had a PCN reaction that required hospitalization No Has patient had a PCN reaction occurring within the last 10 years: Yes If all of the above answers are "NO", then may proceed with Cephalosporin use.   . Codeine Nausea And Vomiting    Unknown childhood rxn    Patient Active Problem List   Diagnosis Date Noted  . Occipital neuralgia 04/25/2014  . Lactose intolerance     Past Medical History:  Diagnosis Date  . Abdominal pain   . Allergic conjunctivitis and rhinitis   . Allergy   . Anal or rectal pain   . Anxiety   . Blood in stool   . Lactose intolerance   . Medical history non-contributory   . Migraine   . Nausea & vomiting   . NSVD (normal spontaneous vaginal delivery) 09/14/2013  . Pre-eclampsia   . Urticaria   . UTI (urinary tract infection)     Past Surgical History:  Procedure Laterality Date  . BREAST BIOPSY Right   . CYST REMOVAL HAND Left 2002   wrist  . TONSILLECTOMY  2010  . WISDOM TOOTH EXTRACTION  2003    Social History   Socioeconomic History  . Marital status: Married    Spouse name: Not on file  . Number of children: Not  on file  . Years of education: Not on file  . Highest education level: Not on file  Occupational History  . Occupation: Event organiser: [employer]    Comment: [employer]  Social Needs  . Financial resource strain: Not on file  . Food insecurity:    Worry: Not on file    Inability: Not on file  . Transportation needs:    Medical: Not on file    Non-medical: Not on file  Tobacco Use  . Smoking status: Never Smoker  . Smokeless tobacco: Never Used  Substance and Sexual Activity  . Alcohol use: Yes    Comment: occ  . Drug use: No  . Sexual activity: Yes    Birth control/protection: None  Lifestyle  . Physical activity:    Days per week: Not on file    Minutes per session: Not  on file  . Stress: Not on file  Relationships  . Social connections:    Talks on phone: Not on file    Gets together: Not on file    Attends religious service: Not on file    Active member of club or organization: Not on file    Attends meetings of clubs or organizations: Not on file    Relationship status: Not on file  . Intimate partner violence:    Fear of current or ex partner: Not on file    Emotionally abused: Not on file    Physically abused: Not on file    Forced sexual activity: Not on file  Other Topics Concern  . Not on file  Social History Narrative   Patient is married with 1 child,and works full time at [employer].    Patient is right handed.   Patient has a Bachelor's degree.   Patient drinks 1 cup every two weeks.    Family History  Problem Relation Age of Onset  . Prostate cancer Paternal Grandfather   . Diabetes Paternal Grandfather   . Alzheimer's disease Paternal Grandfather   . Hypertension Mother   . Allergic rhinitis Mother   . Hypertension Father   . Diabetes Paternal Grandmother   . Diabetes Maternal Grandmother   . Stroke Maternal Grandmother   . Allergic rhinitis Sister   . Eczema Sister   . Asthma Sister   . Migraines Neg Hx   . Angioedema Neg Hx   . Immunodeficiency Neg Hx   . Urticaria Neg Hx      Review of Systems  Constitutional: Positive for chills. Negative for fever and malaise/fatigue.  HENT: Negative.  Negative for congestion, ear pain, nosebleeds and sore throat.   Eyes: Negative.  Negative for discharge and redness.  Respiratory: Negative.  Negative for cough and shortness of breath.   Cardiovascular: Negative.  Negative for chest pain and palpitations.  Gastrointestinal: Positive for abdominal pain, diarrhea and nausea. Negative for blood in stool, melena and vomiting.  Genitourinary: Negative.  Negative for dysuria and hematuria.  Musculoskeletal: Negative.  Negative for back pain, myalgias and neck pain.  Skin:       Mosquito  bites  Neurological: Negative.  Negative for dizziness and headaches.  Endo/Heme/Allergies: Negative.   All other systems reviewed and are negative.   Vitals:   02/04/18 0943  BP: 106/66  Pulse: 77  Resp: 16  Temp: 98 F (36.7 C)  SpO2: 98%    Physical Exam  Constitutional: She is oriented to person, place, and time. She appears well-developed and well-nourished.  HENT:  Head: Normocephalic and atraumatic.  Right Ear: External ear normal.  Left Ear: External ear normal.  Nose: Nose normal.  Mouth/Throat: Oropharynx is clear and moist.  Eyes: Pupils are equal, round, and reactive to light. Conjunctivae and EOM are normal.  Neck: Normal range of motion. Neck supple. No JVD present. Carotid bruit is not present. No Brudzinski's sign and no Kernig's sign noted. No thyromegaly present.  Cardiovascular: Normal rate, regular rhythm and normal heart sounds.  Pulmonary/Chest: Effort normal and breath sounds normal.  Abdominal: Soft. She exhibits no distension and no mass. There is no tenderness.  Musculoskeletal: Normal range of motion.  Lymphadenopathy:    She has no cervical adenopathy.  Neurological: She is alert and oriented to person, place, and time. No cranial nerve deficit or sensory deficit. She exhibits normal muscle tone. Coordination normal.  Skin: Skin is warm and dry. Capillary refill takes less than 2 seconds. No rash noted.  Several mosquito bites in trunk and extremities.  Psychiatric: She has a normal mood and affect. Her behavior is normal.  Vitals reviewed.   No red flag signs or symptoms.  Clinically improving.  Will monitor symptoms.  Instructed to return if no better or worse in the next 48 hours. A total of 25 minutes was spent in the room with the patient, greater than 50% of which was in counseling/coordination of care regarding differential diagnosis, treatment, medications, and need for follow-up if no better or worse.  ASSESSMENT & PLAN: Tiffony was seen  today for diarrhea and headache.  Diagnoses and all orders for this visit:  Nonintractable episodic headache, unspecified headache type -     CBC with Differential/Platelet -     Comprehensive metabolic panel  Diarrhea of presumed infectious origin -     CBC with Differential/Platelet -     Comprehensive metabolic panel  Viral illness    Patient Instructions       IF you received an x-ray today, you will receive an invoice from [facility]. Please contact [facility] at [phone] with questions or concerns regarding your invoice.   IF you received labwork today, you will receive an invoice from [facility]. Please contact [facility] at [phone] with questions or concerns regarding your invoice.   Our billing staff will not be able to assist you with questions regarding bills from these companies.  You will be contacted with the lab results as soon as they are available. The fastest way to get your results is to activate your My Chart account. Instructions are located on the last page of this paperwork. If you have not heard from [place] regarding the results in 2 weeks, please contact this office.     Viral Illness, Adult Viruses are tiny germs that can get into a person's body and cause illness. There are many different types of viruses, and they cause many types of illness. Viral illnesses can range from mild to severe. They can affect various parts of the body. Common illnesses that are caused by a virus include colds and the flu. Viral illnesses also include serious conditions such as HIV/AIDS (human immunodeficiency virus/acquired immunodeficiency syndrome). A few viruses have been linked to certain cancers. What are the causes? Many types of viruses can cause illness. Viruses invade cells in your body, multiply, and cause the infected cells to malfunction or die. When the cell dies, it releases more of the virus. When this happens, you develop symptoms of the  illness, and the virus continues to spread to other  cells. If the virus takes over the function of the cell, it can cause the cell to divide and grow out of control, as is the case when a virus causes cancer. Different viruses get into the body in different ways. You can get a virus by:  Swallowing food or water that is contaminated with the virus.  Breathing in droplets that have been coughed or sneezed into the air by an infected person.  Touching a surface that has been contaminated with the virus and then touching your eyes, nose, or mouth.  Being bitten by an insect or animal that carries the virus.  Having sexual contact with a person who is infected with the virus.  Being exposed to blood or fluids that contain the virus, either through an open cut or during a transfusion.  If a virus enters your body, your body's defense system (immune system) will try to fight the virus. You may be at higher risk for a viral illness if your immune system is weak. What are the signs or symptoms? Symptoms vary depending on the type of virus and the location of the cells that it invades. Common symptoms of the main types of viral illnesses include: Cold and flu viruses  Fever.  Headache.  Sore throat.  Muscle aches.  Nasal congestion.  Cough. Digestive system (gastrointestinal) viruses  Fever.  Abdominal pain.  Nausea.  Diarrhea. Liver viruses (hepatitis)  Loss of appetite.  Tiredness.  Yellowing of the skin (jaundice). Brain and spinal cord viruses  Fever.  Headache.  Stiff neck.  Nausea and vomiting.  Confusion or sleepiness. Skin viruses  Warts.  Itching.  Rash. Sexually transmitted viruses  Discharge.  Swelling.  Redness.  Rash. How is this treated? Viruses can be difficult to treat because they live within cells. Antibiotic medicines do not treat viruses because these drugs do not get inside cells. Treatment for a viral illness may  include:  Resting and drinking plenty of fluids.  Medicines to relieve symptoms. These can include over-the-counter medicine for pain and fever, medicines for cough or congestion, and medicines to relieve diarrhea.  Antiviral medicines. These drugs are available only for certain types of viruses. They may help reduce flu symptoms if taken early. There are also many antiviral medicines for hepatitis and HIV/AIDS.  Some viral illnesses can be prevented with vaccinations. A common example is the flu shot. Follow these instructions at home: Medicines   Take over-the-counter and prescription medicines only as told by your health care provider.  If you were prescribed an antiviral medicine, take it as told by your health care provider. Do not stop taking the medicine even if you start to feel better.  Be aware of when antibiotics are needed and when they are not needed. Antibiotics do not treat viruses. If your health care provider thinks that you may have a bacterial infection as well as a viral infection, you may get an antibiotic. ? Do not ask for an antibiotic prescription if you have been diagnosed with a viral illness. That will not make your illness go away faster. ? Frequently taking antibiotics when they are not needed can lead to antibiotic resistance. When this develops, the medicine no longer works against the bacteria that it normally fights. General instructions  Drink enough fluids to keep your urine clear or pale yellow.  Rest as much as possible.  Return to your normal activities as told by your health care provider. Ask your health care provider what activities are safe  for you.  Keep all follow-up visits as told by your health care provider. This is important. How is this prevented? Take these actions to reduce your risk of viral infection:  Eat a healthy diet and get enough rest.  Wash your hands often with soap and water. This is especially important when you are in  public places. If soap and water are not available, use hand sanitizer.  Avoid close contact with friends and family who have a viral illness.  If you travel to areas where viral gastrointestinal infection is common, avoid drinking water or eating raw food.  Keep your immunizations up to date. Get a flu shot every year as told by your health care provider.  Do not share toothbrushes, nail clippers, razors, or needles with other people.  Always practice safe sex.  Contact a health care provider if:  You have symptoms of a viral illness that do not go away.  Your symptoms come back after going away.  Your symptoms get worse. Get help right away if:  You have trouble breathing.  You have a severe headache or a stiff neck.  You have severe vomiting or abdominal pain. This information is not intended to replace advice given to you by your health care provider. Make sure you discuss any questions you have with your health care provider. Document Released: 12/28/2015 Document Revised: 01/30/2016 Document Reviewed: 12/28/2015 Elsevier Interactive Patient Education  2018 Elsevier Inc.      Edwina BarthMiguel Dartanyan Deasis, MD [facility]

## 2018-02-05 DIAGNOSIS — J029 Acute pharyngitis, unspecified: Secondary | ICD-10-CM | POA: Diagnosis not present

## 2018-02-05 DIAGNOSIS — B349 Viral infection, unspecified: Secondary | ICD-10-CM | POA: Diagnosis not present

## 2018-03-15 DIAGNOSIS — Z01419 Encounter for gynecological examination (general) (routine) without abnormal findings: Secondary | ICD-10-CM | POA: Diagnosis not present

## 2018-03-15 DIAGNOSIS — Z6821 Body mass index (BMI) 21.0-21.9, adult: Secondary | ICD-10-CM | POA: Diagnosis not present

## 2018-03-30 ENCOUNTER — Other Ambulatory Visit: Payer: Self-pay

## 2018-03-30 ENCOUNTER — Ambulatory Visit (INDEPENDENT_AMBULATORY_CARE_PROVIDER_SITE_OTHER): Payer: BLUE CROSS/BLUE SHIELD | Admitting: Family Medicine

## 2018-03-30 ENCOUNTER — Encounter: Payer: Self-pay | Admitting: Family Medicine

## 2018-03-30 VITALS — BP 108/86 | HR 96 | Temp 98.1°F | Ht 63.5 in | Wt 120.2 lb

## 2018-03-30 DIAGNOSIS — R21 Rash and other nonspecific skin eruption: Secondary | ICD-10-CM | POA: Diagnosis not present

## 2018-03-30 DIAGNOSIS — W57XXXA Bitten or stung by nonvenomous insect and other nonvenomous arthropods, initial encounter: Secondary | ICD-10-CM

## 2018-03-30 MED ORDER — TRIAMCINOLONE ACETONIDE 0.1 % EX CREA: 1.0000 "application " | TOPICAL_CREAM | Freq: Two times a day (BID) | CUTANEOUS | 0 refills | Status: DC

## 2018-03-30 NOTE — Patient Instructions (Signed)
     IF you received an x-ray today, you will receive an invoice from [facility]. Please contact [facility] at [phone] with questions or concerns regarding your invoice.   IF you received labwork today, you will receive an invoice from [facility]. Please contact [facility] at [phone] with questions or concerns regarding your invoice.   Our billing staff will not be able to assist you with questions regarding bills from these companies.  You will be contacted with the lab results as soon as they are available. The fastest way to get your results is to activate your My Chart account. Instructions are located on the last page of this paperwork. If you have not heard from us regarding the results in 2 weeks, please contact this office.     

## 2018-03-30 NOTE — Progress Notes (Signed)
7/30/201910:37 AM  Nancy Boyer 1986-02-06, 32 y.o. female [id number]  Chief Complaint  Patient presents with  . Pruritis    having a breakout on the body of itchy bumps. Thinks it may be bedbugs. [place] sure [place]    HPI:   Patient is a 32 y.o. female who presents today for insect bites that started about 2-3 days ago  She states she first noticed itchy red bumps on her right ankles about 2 weeks ago, and then about 3 days ago similar ones on her left arm and then started having them on her right arm, right neck the next day  The bumps seem to be in groupings, are not changing size.  She denies any recent hiking, or possible tick exposures Denies any fevers or chills  She just wants to make sure they are not contagious as she is about to travel internationally  Fall Risk  03/30/2018 02/04/2018 10/01/2017  Falls in the past year? No No No     Depression screen [facility] 2/9 03/30/2018 02/04/2018 10/01/2017  Decreased Interest 0 0 0  Down, Depressed, Hopeless 0 0 0  PHQ - 2 Score 0 0 0    Allergies  Allergen Reactions  . Penicillins Swelling and Rash    Has patient had a PCN reaction causing immediate rash, facial/tongue/throat swelling, SOB or lightheadedness with hypotension: Yes Has patient had a PCN reaction causing severe rash involving mucus membranes or skin necrosis: Yes Has patient had a PCN reaction that required hospitalization No Has patient had a PCN reaction occurring within the last 10 years: Yes If all of the above answers are "NO", then may proceed with Cephalosporin use.   . Codeine Nausea And Vomiting    Unknown childhood rxn    Prior to Admission medications   Medication Sig Start Date End Date Taking? Authorizing Provider  valACYclovir (VALTREX) 500 MG tablet  09/10/17  Yes [provider]    Past Medical History:  Diagnosis Date  . Abdominal pain   . Allergic conjunctivitis and rhinitis   . Allergy   . Anal or  rectal pain   . Anxiety   . Blood in stool   . Lactose intolerance   . Medical history non-contributory   . Migraine   . Nausea & vomiting   . NSVD (normal spontaneous vaginal delivery) 09/14/2013  . Pre-eclampsia   . Urticaria   . UTI (urinary tract infection)     Past Surgical History:  Procedure Laterality Date  . BREAST BIOPSY Right   . CYST REMOVAL HAND Left 2002   wrist  . TONSILLECTOMY  2010  . WISDOM TOOTH EXTRACTION  2003    Social History   Tobacco Use  . Smoking status: Never Smoker  . Smokeless tobacco: Never Used  Substance Use Topics  . Alcohol use: Yes    Comment: occ    Family History  Problem Relation Age of Onset  . Prostate cancer Paternal Grandfather   . Diabetes Paternal Grandfather   . Alzheimer's disease Paternal Grandfather   . Hypertension Mother   . Allergic rhinitis Mother   . Hypertension Father   . Diabetes Paternal Grandmother   . Diabetes Maternal Grandmother   . Stroke Maternal Grandmother   . Allergic rhinitis Sister   . Eczema Sister   . Asthma Sister   . Migraines Neg Hx   . Angioedema Neg Hx   . Immunodeficiency Neg Hx   . Urticaria Neg  Hx     ROS Per hpi  OBJECTIVE:  Blood pressure 108/86, pulse 96, temperature 98.1 F (36.7 C), temperature source Oral, height 5' 3.5" (1.613 m), weight 120 lb 3.2 oz (54.5 kg), last menstrual period 03/18/2018, SpO2 100 %, unknown if currently breastfeeding. Body mass index is 20.96 kg/m.   Physical Exam Gen: AAOx3, NAD Skin: discrete erythematous papules, some with tiny central clear vesicle, right inner ankle (2 weeks ago very exorciated), BUE, right neck    ASSESSMENT and PLAN  1. Rash and nonspecific skin eruption 2. Insect bite, unspecified site, initial encounter Discussed supportive measures, new meds r/se/b and RTC precautions.  Other orders - triamcinolone cream (KENALOG) 0.1 %; Apply 1 application topically 2 (two) times daily.  Return if symptoms worsen or fail  to improve.    Myles LippsIrma M Santiago, MD [facility] [address] [place], [place] [address] Ph.  [phone] Fax [phone]

## 2018-06-03 DIAGNOSIS — R102 Pelvic and perineal pain: Secondary | ICD-10-CM | POA: Diagnosis not present

## 2018-06-28 DIAGNOSIS — Z713 Dietary counseling and surveillance: Secondary | ICD-10-CM | POA: Diagnosis not present

## 2018-06-28 DIAGNOSIS — Z136 Encounter for screening for cardiovascular disorders: Secondary | ICD-10-CM | POA: Diagnosis not present

## 2018-06-28 DIAGNOSIS — Z131 Encounter for screening for diabetes mellitus: Secondary | ICD-10-CM | POA: Diagnosis not present

## 2018-06-28 DIAGNOSIS — Z13220 Encounter for screening for lipoid disorders: Secondary | ICD-10-CM | POA: Diagnosis not present

## 2018-07-06 ENCOUNTER — Ambulatory Visit: Payer: Self-pay | Admitting: *Deleted

## 2018-07-06 NOTE — Telephone Encounter (Signed)
I called the patient to see if we could get her in for an appointment. She refuses to see any other provider here. She stated that she feels like you are the only one here that she is comfortable with. She also, stated that she have seen a few providers here and they have been completely wrong. Dr. Alvy Bimler seems to be on point. "I don't want to pay money that I don't already have", not saying this in a disrespectful manner,but I feel most comfortable with him. She stated that she was told that you have on call appointments of Friday (same day). She stated that she will see how she feels on tomorrow. She has an appointment with you on 07/21/18, @9 :40am.  Pt advised that if symptoms worsen to go to the ED or Urgent Care. Per pt stated that she will see how she feels and if she gets worse she will go to ED.

## 2018-07-06 NOTE — Telephone Encounter (Signed)
Patient is calling with muscle weakness, fatigue, pain on left side, frequent diarrhea. Patient states her symptoms have been going on for over 1 month- but she is getting to the point to wear she is getting weak and she is existing. She will only make an appointment with Dr Alvy Bimler. Patient advised UC  For immediate evaluation - but she refuses.She will call back to see if anyone cancels- or she can get a same day when she can come.  Reason for Disposition . [1] MODERATE weakness (i.e., interferes with work, school, normal activities) AND [2] cause unknown  (Exceptions: weakness with acute minor illness, or weakness from poor fluid intake)  Answer Assessment - Initial Assessment Questions 1. DESCRIPTION: "Describe how you are feeling."     Weakness and fatigue, cold and shaking- mostly at night, L sided pain 2. SEVERITY: "How bad is it?"  "Can you stand and walk?"   - MILD - Feels weak or tired, but does not interfere with work, school or normal activities   - MODERATE - Able to stand and walk; weakness interferes with work, school, or normal activities   - SEVERE - Unable to stand or walk     mild 3. ONSET:  "When did the weakness begin?"     Over whelming exhaustion-1 month 4. CAUSE: "What do you think is causing the weakness?"     Muscle weakness 5. MEDICINES: "Have you recently started a new medicine or had a change in the amount of a medicine?"     Patient takes fish oil, multi vitamin 6. OTHER SYMPTOMS: "Do you have any other symptoms?" (e.g., chest pain, fever, cough, SOB, vomiting, diarrhea, bleeding, other areas of pain)     Nose bleeds, diarrhea- first thing in morning and 2-3 times during day,left under rib cage, legs weakness and arms shakes 7. PREGNANCY: "Is there any chance you are pregnant?" "When was your last menstrual period?"     LMP- 07/03/18  Protocols used: WEAKNESS (GENERALIZED) AND FATIGUE-A-AH

## 2018-07-20 ENCOUNTER — Encounter

## 2018-07-21 ENCOUNTER — Ambulatory Visit (INDEPENDENT_AMBULATORY_CARE_PROVIDER_SITE_OTHER): Payer: BLUE CROSS/BLUE SHIELD | Admitting: Emergency Medicine

## 2018-07-21 ENCOUNTER — Encounter: Payer: Self-pay | Admitting: Emergency Medicine

## 2018-07-21 ENCOUNTER — Other Ambulatory Visit: Payer: Self-pay

## 2018-07-21 VITALS — BP 122/74 | HR 80 | Temp 98.9°F | Resp 16 | Ht 63.5 in | Wt 119.8 lb

## 2018-07-21 DIAGNOSIS — R109 Unspecified abdominal pain: Secondary | ICD-10-CM | POA: Diagnosis not present

## 2018-07-21 DIAGNOSIS — R10A2 Flank pain, left side: Secondary | ICD-10-CM | POA: Insufficient documentation

## 2018-07-21 DIAGNOSIS — R1012 Left upper quadrant pain: Secondary | ICD-10-CM | POA: Diagnosis not present

## 2018-07-21 LAB — CBC WITH DIFFERENTIAL/PLATELET
Basophils Absolute: 0 10*3/uL (ref 0.0–0.2)
Basos: 0 %
EOS (ABSOLUTE): 0.1 10*3/uL (ref 0.0–0.4)
EOS: 2 %
Hematocrit: 39.1 % (ref 34.0–46.6)
Hemoglobin: 12.7 g/dL (ref 11.1–15.9)
IMMATURE GRANULOCYTES: 0 %
Immature Grans (Abs): 0 10*3/uL (ref 0.0–0.1)
LYMPHS: 29 %
Lymphocytes Absolute: 2 10*3/uL (ref 0.7–3.1)
MCH: 28.2 pg (ref 26.6–33.0)
MCHC: 32.5 g/dL (ref 31.5–35.7)
MCV: 87 fL (ref 79–97)
MONOCYTES: 8 %
Monocytes Absolute: 0.6 10*3/uL (ref 0.1–0.9)
NEUTROS PCT: 61 %
Neutrophils Absolute: 4.3 10*3/uL (ref 1.4–7.0)
PLATELETS: 264 10*3/uL (ref 150–450)
RBC: 4.51 x10E6/uL (ref 3.77–5.28)
RDW: 11.8 % — AB (ref 12.3–15.4)
WBC: 7 10*3/uL (ref 3.4–10.8)

## 2018-07-21 LAB — POCT URINALYSIS DIP (MANUAL ENTRY)
BILIRUBIN UA: NEGATIVE
BILIRUBIN UA: NEGATIVE mg/dL
Blood, UA: NEGATIVE
Glucose, UA: NEGATIVE mg/dL
NITRITE UA: NEGATIVE
PH UA: 6 (ref 5.0–8.0)
PROTEIN UA: NEGATIVE mg/dL
Spec Grav, UA: 1.025 (ref 1.010–1.025)
Urobilinogen, UA: 0.2 E.U./dL

## 2018-07-21 LAB — COMPREHENSIVE METABOLIC PANEL
ALK PHOS: 48 IU/L (ref 39–117)
ALT: 25 IU/L (ref 0–32)
AST: 24 IU/L (ref 0–40)
Albumin/Globulin Ratio: 1.9 (ref 1.2–2.2)
Albumin: 4.5 g/dL (ref 3.5–5.5)
BUN/Creatinine Ratio: 15 (ref 9–23)
BUN: 14 mg/dL (ref 6–20)
Bilirubin Total: 0.5 mg/dL (ref 0.0–1.2)
CALCIUM: 9.5 mg/dL (ref 8.7–10.2)
CO2: 20 mmol/L (ref 20–29)
CREATININE: 0.94 mg/dL (ref 0.57–1.00)
Chloride: 101 mmol/L (ref 96–106)
GFR calc Af Amer: 93 mL/min/{1.73_m2} (ref >59)
GFR, EST NON AFRICAN AMERICAN: 81 mL/min/{1.73_m2} (ref >59)
GLUCOSE: 159 mg/dL — AB (ref 65–99)
Globulin, Total: 2.4 g/dL (ref 1.5–4.5)
Potassium: 4.6 mmol/L (ref 3.5–5.2)
Sodium: 137 mmol/L (ref 134–144)
Total Protein: 6.9 g/dL (ref 6.0–8.5)

## 2018-07-21 LAB — POCT URINE PREGNANCY: Preg Test, Ur: NEGATIVE

## 2018-07-21 NOTE — Progress Notes (Signed)
Nancy Boyer 32 y.o.   Chief Complaint  Patient presents with  . Flank Pain    X 2 1/2 months - per patient to the left kidney area to back   . Weight Check    concern and discuss - triage changed somewhat    HISTORY OF PRESENT ILLNESS: This is a 32 y.o. female complaining of left flank pain for 2-1/2 months.  Denies trauma.  Has felt some urinary changes but no other significant symptoms.  HPI   Prior to Admission medications   Medication Sig Start Date End Date Taking? Authorizing Provider  valACYclovir (VALTREX) 500 MG tablet  09/10/17  Yes [provider]  triamcinolone cream (KENALOG) 0.1 % Apply 1 application topically 2 (two) times daily. 03/30/18   Myles Lipps, MD    Allergies  Allergen Reactions  . Penicillins Swelling and Rash    Has patient had a PCN reaction causing immediate rash, facial/tongue/throat swelling, SOB or lightheadedness with hypotension: Yes Has patient had a PCN reaction causing severe rash involving mucus membranes or skin necrosis: Yes Has patient had a PCN reaction that required hospitalization No Has patient had a PCN reaction occurring within the last 10 years: Yes If all of the above answers are "NO", then may proceed with Cephalosporin use.   . Codeine Nausea And Vomiting    Unknown childhood rxn    Patient Active Problem List   Diagnosis Date Noted  . Diarrhea of presumed infectious origin 02/04/2018  . Nonintractable episodic headache 02/04/2018  . Viral illness 02/04/2018  . Lactose intolerance     Past Medical History:  Diagnosis Date  . Abdominal pain   . Allergic conjunctivitis and rhinitis   . Allergy   . Anal or rectal pain   . Anxiety   . Blood in stool   . Lactose intolerance   . Medical history non-contributory   . Migraine   . Nausea & vomiting   . NSVD (normal spontaneous vaginal delivery) 09/14/2013  . Pre-eclampsia   . Urticaria   . UTI (urinary tract infection)     Past Surgical  History:  Procedure Laterality Date  . BREAST BIOPSY Right   . CYST REMOVAL HAND Left 2002   wrist  . TONSILLECTOMY  2010  . WISDOM TOOTH EXTRACTION  2003    Social History   Socioeconomic History  . Marital status: Married    Spouse name: Not on file  . Number of children: Not on file  . Years of education: Not on file  . Highest education level: Not on file  Occupational History  . Occupation: Event organiser: [employer]    Comment: [employer]  Social Needs  . Financial resource strain: Not on file  . Food insecurity:    Worry: Not on file    Inability: Not on file  . Transportation needs:    Medical: Not on file    Non-medical: Not on file  Tobacco Use  . Smoking status: Never Smoker  . Smokeless tobacco: Never Used  Substance and Sexual Activity  . Alcohol use: Yes    Comment: occ  . Drug use: No  . Sexual activity: Yes    Birth control/protection: None  Lifestyle  . Physical activity:    Days per week: Not on file    Minutes per session: Not on file  . Stress: Not on file  Relationships  . Social connections:    Talks on phone: Not on file  Gets together: Not on file    Attends religious service: Not on file    Active member of club or organization: Not on file    Attends meetings of clubs or organizations: Not on file    Relationship status: Not on file  . Intimate partner violence:    Fear of current or ex partner: Not on file    Emotionally abused: Not on file    Physically abused: Not on file    Forced sexual activity: Not on file  Other Topics Concern  . Not on file  Social History Narrative   Patient is married with 1 child,and works full time at [employer].    Patient is right handed.   Patient has a Bachelor's degree.   Patient drinks 1 cup every two weeks.    Family History  Problem Relation Age of Onset  . Prostate cancer Paternal Grandfather   . Diabetes Paternal Grandfather   . Alzheimer's disease Paternal Grandfather   .  Hypertension Mother   . Allergic rhinitis Mother   . Hypertension Father   . Diabetes Paternal Grandmother   . Diabetes Maternal Grandmother   . Stroke Maternal Grandmother   . Allergic rhinitis Sister   . Eczema Sister   . Asthma Sister   . Migraines Neg Hx   . Angioedema Neg Hx   . Immunodeficiency Neg Hx   . Urticaria Neg Hx      Review of Systems  Constitutional: Negative.  Negative for chills and fever.  HENT: Negative.  Negative for congestion, nosebleeds and sore throat.   Eyes: Negative.  Negative for blurred vision and double vision.  Respiratory: Negative.  Negative for cough and hemoptysis.   Cardiovascular: Negative.  Negative for chest pain and palpitations.  Gastrointestinal: Negative.  Negative for abdominal pain, diarrhea, nausea and vomiting.  Genitourinary: Positive for flank pain. Negative for dysuria, frequency, hematuria and urgency.  Musculoskeletal: Negative for back pain, myalgias and neck pain.  Skin: Negative.  Negative for rash.  Neurological: Negative.  Negative for dizziness, sensory change, focal weakness and headaches.  Endo/Heme/Allergies: Negative.   All other systems reviewed and are negative.   Vitals:   07/21/18 1031  BP: 122/74  Pulse: 80  Resp: 16  Temp: 98.9 F (37.2 C)  SpO2: 100%    Physical Exam  Constitutional: She is oriented to person, place, and time. She appears well-developed and well-nourished.  HENT:  Head: Normocephalic and atraumatic.  Nose: Nose normal.  Mouth/Throat: Oropharynx is clear and moist.  Eyes: Pupils are equal, round, and reactive to light. Conjunctivae and EOM are normal.  Neck: Normal range of motion. Neck supple.  Cardiovascular: Normal rate, regular rhythm and normal heart sounds.  Pulmonary/Chest: Effort normal and breath sounds normal.  Abdominal: Soft. Bowel sounds are normal. She exhibits no distension and no mass. There is tenderness. There is CVA tenderness (left). There is no rebound and no  guarding.  Musculoskeletal: Normal range of motion.  Neurological: She is alert and oriented to person, place, and time. No sensory deficit. She exhibits normal muscle tone. Coordination normal.  Skin: Skin is warm and dry. Capillary refill takes less than 2 seconds.  Psychiatric: She has a normal mood and affect. Her behavior is normal.  Vitals reviewed.  Results for orders placed or performed in visit on 07/21/18 (from the past 24 hour(s))  POCT urinalysis dipstick     Status: Abnormal   Collection Time: 07/21/18 10:55 AM  Result Value Ref Range  Color, UA yellow yellow   Clarity, UA clear clear   Glucose, UA negative negative mg/dL   Bilirubin, UA negative negative   Ketones, POC UA negative negative mg/dL   Spec Grav, UA 1.610 9.604 - 1.025   Blood, UA negative negative   pH, UA 6.0 5.0 - 8.0   Protein Ur, POC negative negative mg/dL   Urobilinogen, UA 0.2 0.2 or 1.0 E.U./dL   Nitrite, UA Negative Negative   Leukocytes, UA Small (1+) (A) Negative  POCT urine pregnancy     Status: None   Collection Time: 07/21/18 10:55 AM  Result Value Ref Range   Preg Test, Ur Negative Negative     ASSESSMENT & PLAN: Nancy Boyer was seen today for flank pain and weight check.  Diagnoses and all orders for this visit:  Left flank pain -     POCT urinalysis dipstick -     POCT urine pregnancy -     Urine Culture -     Comprehensive metabolic panel -     CBC with Differential/Platelet -     US Renal; Future   Patient Instructions       If you have lab work done today you will be contacted with your lab results within the next 2 weeks.  If you have not heard from [place] then please contact us. The fastest way to get your results is to register for My Chart.   IF you received an x-ray today, you will receive an invoice from [facility]. Please contact [facility] at [phone] with questions or concerns regarding your invoice.   IF you received labwork today, you  will receive an invoice from [facility]. Please contact [facility] at [phone] with questions or concerns regarding your invoice.   Our billing staff will not be able to assist you with questions regarding bills from these companies.  You will be contacted with the lab results as soon as they are available. The fastest way to get your results is to activate your My Chart account. Instructions are located on the last page of this paperwork. If you have not heard from [place] regarding the results in 2 weeks, please contact this office.    Flank Pain Flank pain is pain in your side. The flank is the area of your side between your upper belly (abdomen) and your back. The pain may occur over a short period of time (acute) or may be long-term or come back often (chronic). It may be mild or very bad. Pain in this area can be caused by many different things. Follow these instructions at home:  Rest as told by your doctor.  Drink enough fluid to keep your pee (urine) clear or pale yellow.  Take over-the-counter and prescription medicines only as told by your doctor.  Keep all follow-up visits as told by your doctor. This is important. Contact a doctor if:  Medicine does not help your pain.  You have new symptoms.  Your pain gets worse.  You have a fever.  Your symptoms last longer than 2-3 days. Get help right away if:  Your tummy hurts or is swollen.  You are short of breath.  You feel sick to your stomach (nauseous) and it does not go away.  You cannot stop throwing up (vomiting).  You feel like you will pass out or you do pass out (faint).  You have blood in your pee.  You have a fever and your symptoms suddenly get worse. This information is  not intended to replace advice given to you by your health care provider. Make sure you discuss any questions you have with your health care provider. Document Released: 05/27/2008 Document Revised: 05/09/2016 Document Reviewed:  05/22/2015 Elsevier Interactive Patient Education  2018 Elsevier Inc.      Edwina BarthMiguel Marcy Sookdeo, MD [facility]

## 2018-07-21 NOTE — Patient Instructions (Addendum)
     If you have lab work done today you will be contacted with your lab results within the next 2 weeks.  If you have not heard from us then please contact us. The fastest way to get your results is to register for My Chart.   IF you received an x-ray today, you will receive an invoice from [facility]. Please contact [facility] at [phone] with questions or concerns regarding your invoice.   IF you received labwork today, you will receive an invoice from [facility]. Please contact [facility] at [phone] with questions or concerns regarding your invoice.   Our billing staff will not be able to assist you with questions regarding bills from these companies.  You will be contacted with the lab results as soon as they are available. The fastest way to get your results is to activate your My Chart account. Instructions are located on the last page of this paperwork. If you have not heard from us regarding the results in 2 weeks, please contact this office.     Flank Pain Flank pain is pain in your side. The flank is the area of your side between your upper belly (abdomen) and your back. The pain may occur over a short period of time (acute) or may be long-term or come back often (chronic). It may be mild or very bad. Pain in this area can be caused by many different things. Follow these instructions at home:  Rest as told by your doctor.  Drink enough fluid to keep your pee (urine) clear or pale yellow.  Take over-the-counter and prescription medicines only as told by your doctor.  Keep all follow-up visits as told by your doctor. This is important. Contact a doctor if:  Medicine does not help your pain.  You have new symptoms.  Your pain gets worse.  You have a fever.  Your symptoms last longer than 2-3 days. Get help right away if:  Your tummy hurts or is swollen.  You are short of breath.  You feel sick to your stomach (nauseous) and it does not go  away.  You cannot stop throwing up (vomiting).  You feel like you will pass out or you do pass out (faint).  You have blood in your pee.  You have a fever and your symptoms suddenly get worse. This information is not intended to replace advice given to you by your health care provider. Make sure you discuss any questions you have with your health care provider. Document Released: 05/27/2008 Document Revised: 05/09/2016 Document Reviewed: 05/22/2015 Elsevier Interactive Patient Education  2018 Elsevier Inc.  

## 2018-07-22 LAB — URINE CULTURE: ORGANISM ID, BACTERIA: NO GROWTH

## 2018-08-12 ENCOUNTER — Telehealth: Payer: Self-pay | Admitting: Emergency Medicine

## 2018-08-12 NOTE — Telephone Encounter (Signed)
She would like to know her results   Thank you   Copied from CRM [phone] [id number]. Topic: Referral - Status >> Aug 11, 2018  3:36 PM Nancy Boyer, Nancy Boyer wrote: Reason for CRM:  [facility]  She would like Boyer call back to check on the status of this   Best number call back [phone]

## 2018-08-12 NOTE — Telephone Encounter (Signed)
Did she get this ultrasound done?  If she did, can we please get the results?  Thanks.

## 2018-08-12 NOTE — Telephone Encounter (Signed)
[facility]27 REROUTED AGAIN 12/12

## 2018-08-13 NOTE — Telephone Encounter (Signed)
The question still remains.

## 2018-08-18 DIAGNOSIS — Z3169 Encounter for other general counseling and advice on procreation: Secondary | ICD-10-CM | POA: Diagnosis not present

## 2018-09-01 NOTE — L&D Delivery Note (Signed)
Delivery Note  SVD viable female Apgars 8,9 over intact perinuem.  Placenta delivered spontaneously intact with 3VC. Good support and hemostasis noted.  R/V exam confirms.  PH art was sent.   Mother and baby to couplet care and are doing well.  EBL 100cc  Louretta Shorten, MD

## 2018-09-06 DIAGNOSIS — N979 Female infertility, unspecified: Secondary | ICD-10-CM | POA: Diagnosis not present

## 2018-09-22 ENCOUNTER — Other Ambulatory Visit: Payer: BLUE CROSS/BLUE SHIELD

## 2018-09-23 ENCOUNTER — Other Ambulatory Visit: Payer: BLUE CROSS/BLUE SHIELD

## 2018-09-25 ENCOUNTER — Other Ambulatory Visit: Payer: BLUE CROSS/BLUE SHIELD

## 2018-09-28 ENCOUNTER — Ambulatory Visit (INDEPENDENT_AMBULATORY_CARE_PROVIDER_SITE_OTHER): Payer: BLUE CROSS/BLUE SHIELD

## 2018-09-28 DIAGNOSIS — R109 Unspecified abdominal pain: Secondary | ICD-10-CM | POA: Diagnosis not present

## 2018-10-01 DIAGNOSIS — N979 Female infertility, unspecified: Secondary | ICD-10-CM | POA: Diagnosis not present

## 2018-10-28 DIAGNOSIS — Z3490 Encounter for supervision of normal pregnancy, unspecified, unspecified trimester: Secondary | ICD-10-CM | POA: Diagnosis not present

## 2018-10-28 DIAGNOSIS — N760 Acute vaginitis: Secondary | ICD-10-CM | POA: Diagnosis not present

## 2018-10-28 DIAGNOSIS — N911 Secondary amenorrhea: Secondary | ICD-10-CM | POA: Diagnosis not present

## 2018-10-30 DIAGNOSIS — O3680X9 Pregnancy with inconclusive fetal viability, other fetus: Secondary | ICD-10-CM | POA: Diagnosis not present

## 2018-11-01 DIAGNOSIS — N912 Amenorrhea, unspecified: Secondary | ICD-10-CM | POA: Diagnosis not present

## 2018-11-02 ENCOUNTER — Inpatient Hospital Stay (HOSPITAL_COMMUNITY)
Admission: AD | Admit: 2018-11-02 | Discharge: 2018-11-02 | Disposition: A | Discharge status: Home / Self Care | Payer: BLUE CROSS/BLUE SHIELD | Attending: Obstetrics and Gynecology | Admitting: Obstetrics and Gynecology

## 2018-11-02 ENCOUNTER — Encounter (HOSPITAL_COMMUNITY): Payer: Self-pay

## 2018-11-02 ENCOUNTER — Other Ambulatory Visit: Payer: Self-pay

## 2018-11-02 DIAGNOSIS — O009 Unspecified ectopic pregnancy without intrauterine pregnancy: Secondary | ICD-10-CM | POA: Diagnosis not present

## 2018-11-02 DIAGNOSIS — Z3A01 Less than 8 weeks gestation of pregnancy: Secondary | ICD-10-CM | POA: Insufficient documentation

## 2018-11-02 DIAGNOSIS — O00101 Right tubal pregnancy without intrauterine pregnancy: Secondary | ICD-10-CM

## 2018-11-02 DIAGNOSIS — N911 Secondary amenorrhea: Secondary | ICD-10-CM | POA: Diagnosis not present

## 2018-11-02 LAB — CBC WITH DIFFERENTIAL/PLATELET
Abs Immature Granulocytes: 0.02 10*3/uL (ref 0.00–0.07)
BASOS PCT: 0 %
Basophils Absolute: 0 10*3/uL (ref 0.0–0.1)
EOS ABS: 0.2 10*3/uL (ref 0.0–0.5)
EOS PCT: 2 %
HEMATOCRIT: 36.3 % (ref 36.0–46.0)
Hemoglobin: 12.4 g/dL (ref 12.0–15.0)
Immature Granulocytes: 0 %
LYMPHS ABS: 2.1 10*3/uL (ref 0.7–4.0)
Lymphocytes Relative: 30 %
MCH: 29.7 pg (ref 26.0–34.0)
MCHC: 34.2 g/dL (ref 30.0–36.0)
MCV: 87.1 fL (ref 80.0–100.0)
MONO ABS: 0.5 10*3/uL (ref 0.1–1.0)
MONOS PCT: 7 %
Neutro Abs: 4.4 10*3/uL (ref 1.7–7.7)
Neutrophils Relative %: 61 %
PLATELETS: 254 10*3/uL (ref 150–400)
RBC: 4.17 MIL/uL (ref 3.87–5.11)
RDW: 12.4 % (ref 11.5–15.5)
WBC: 7.2 10*3/uL (ref 4.0–10.5)
nRBC: 0 % (ref 0.0–0.2)

## 2018-11-02 LAB — COMPREHENSIVE METABOLIC PANEL
ALT: 15 U/L (ref 0–44)
AST: 21 U/L (ref 15–41)
Albumin: 3.7 g/dL (ref 3.5–5.0)
Alkaline Phosphatase: 42 U/L (ref 38–126)
Anion gap: 7 (ref 5–15)
BILIRUBIN TOTAL: 0.4 mg/dL (ref 0.3–1.2)
BUN: 11 mg/dL (ref 6–20)
CHLORIDE: 108 mmol/L (ref 98–111)
CO2: 24 mmol/L (ref 22–32)
Calcium: 8.8 mg/dL — ABNORMAL LOW (ref 8.9–10.3)
Creatinine, Ser: 0.74 mg/dL (ref 0.44–1.00)
Glucose, Bld: 100 mg/dL — ABNORMAL HIGH (ref 70–99)
POTASSIUM: 4.3 mmol/L (ref 3.5–5.1)
Sodium: 139 mmol/L (ref 135–145)
TOTAL PROTEIN: 6.2 g/dL — AB (ref 6.5–8.1)

## 2018-11-02 MED ORDER — METHOTREXATE FOR ECTOPIC PREGNANCY
50.0000 mg/m2 | Freq: Once | INTRAMUSCULAR | Status: AC
  Administered 2018-11-02: 80 mg via INTRAMUSCULAR
  Filled 2018-11-02: qty 1

## 2018-11-02 NOTE — Discharge Instructions (Signed)
Ectopic Pregnancy  An ectopic pregnancy happens when a fertilized egg grows outside the womb (uterus). The fertilized egg cannot stay alive outside of the womb. This problem often happens in a fallopian tube. It is often caused by damage to the tube. If this problem is found early, you may be treated with medicine that stops the egg from growing. If your tube tears or bursts open (ruptures), you will bleed inside. Often, there is very bad pain in the lower belly. This is an emergency. You will need surgery. Get help right away. Follow these instructions at home: After being treated with medicine or surgery:  Rest and limit your activity for as long as told by your doctor.  Until your doctor says that it is safe: ? Do not lift anything that is heavier than 10 lb (4.5 kg) or the limit that your doctor tells you. ? Avoid exercise and any movement that takes a lot of effort.  To prevent problems when pooping (constipation): ? Eat a healthy diet. This includes:  Fruits.  Vegetables.  Whole grains. ? Drink 6-8 glasses of water a day. Contact a doctor if: Get help right away if:  You have sudden and very bad pain in your belly.  You have very bad pain in your shoulders or neck.  You have pain that gets worse and is not helped by medicine.  You have: ? A fever or chills. ? Vaginal bleeding. ? Redness or swelling at the site of a surgical cut (incision).  You feel sick to your stomach (nauseous) or you throw up (vomit).  You feel dizzy or weak.  You feel light-headed or you pass out (faint). Summary  An ectopic pregnancy happens when a fertilized egg grows outside the womb (uterus).  If this problem is found early, you may be treated with medicine that stops the egg from growing.  If your tube tears or bursts open (ruptures), you will need surgery. This is an emergency. Get help right away. This information is not intended to replace advice given to you by your health care  provider. Make sure you discuss any questions you have with your health care provider. Document Released: 11/14/2008 Document Revised: 09/11/2016 Document Reviewed: 09/11/2016 Elsevier Interactive Patient Education  2019 Elsevier Inc. Methotrexate injection What is this medicine? METHOTREXATE (METH oh TREX ate) is a chemotherapy drug used to treat cancer including breast cancer, leukemia, and lymphoma. This medicine can also be used to treat psoriasis and certain kinds of arthritis. This medicine may be used for other purposes; ask your health care provider or pharmacist if you have questions. What should I tell my health care provider before I take this medicine? They need to know if you have any of these conditions: -fluid in the stomach area or lungs -if you often drink alcohol -infection or immune system problems -kidney disease -liver disease -low blood counts, like low white cell, platelet, or red cell counts -lung disease -radiation therapy -stomach ulcers -ulcerative colitis -an unusual or allergic reaction to methotrexate, other medicines, foods, dyes, or preservatives -pregnant or trying to get pregnant -breast-feeding How should I use this medicine? This medicine is for infusion into a vein or for injection into muscle or into the spinal fluid (whichever applies). It is usually given by a health care professional in a hospital or clinic setting. In rare cases, you might get this medicine at home. You will be taught how to give this medicine. Use exactly as directed. Take your medicine at  regular intervals. Do not take your medicine more often than directed. If this medicine is used for arthritis or psoriasis, it should be taken weekly, NOT daily. It is important that you put your used needles and syringes in a special sharps container. Do not put them in a trash can. If you do not have a sharps container, call your pharmacist or healthcare provider to get one. Talk to your  pediatrician regarding the use of this medicine in children. While this drug may be prescribed for children as young as 2 years for selected conditions, precautions do apply. Overdosage: If you think you have taken too much of this medicine contact a poison control center or emergency room at once. NOTE: This medicine is only for you. Do not share this medicine with others. What if I miss a dose? It is important not to miss your dose. Call your doctor or health care professional if you are unable to keep an appointment. If you give yourself the medicine and you miss a dose, talk with your doctor or health care professional. Do not take double or extra doses. What may interact with this medicine? This medicine may interact with the following medications: -acitretin -aspirin or aspirin-like medicines including salicylates -azathioprine -certain antibiotics like chloramphenicol, penicillin, tetracycline -certain medicines for stomach problems like esomeprazole, omeprazole, pantoprazole -cyclosporine -gold -hydroxychloroquine -live virus vaccines -mercaptopurine -NSAIDs, medicines for pain and inflammation, like ibuprofen or naproxen -other cytotoxic agents -penicillamine -phenylbutazone -phenytoin -probenacid -retinoids such as isotretinoin and tretinoin -steroid medicines like prednisone or cortisone -sulfonamides like sulfasalazine and trimethoprim/sulfamethoxazole -theophylline This list may not describe all possible interactions. Give your health care provider a list of all the medicines, herbs, non-prescription drugs, or dietary supplements you use. Also tell them if you smoke, drink alcohol, or use illegal drugs. Some items may interact with your medicine. What should I watch for while using this medicine? Avoid alcoholic drinks. In some cases, you may be given additional medicines to help with side effects. Follow all directions for their use. This medicine can make you more  sensitive to the sun. Keep out of the sun. If you cannot avoid being in the sun, wear protective clothing and use sunscreen. Do not use sun lamps or tanning beds/booths. You may get drowsy or dizzy. Do not drive, use machinery, or do anything that needs mental alertness until you know how this medicine affects you. Do not stand or sit up quickly, especially if you are an older patient. This reduces the risk of dizzy or fainting spells. You may need blood work done while you are taking this medicine. Call your doctor or health care professional for advice if you get a fever, chills or sore throat, or other symptoms of a cold or flu. Do not treat yourself. This drug decreases your body's ability to fight infections. Try to avoid being around people who are sick. This medicine may increase your risk to bruise or bleed. Call your doctor or health care professional if you notice any unusual bleeding. Check with your doctor or health care professional if you get an attack of severe diarrhea, nausea and vomiting, or if you sweat a lot. The loss of too much body fluid can make it dangerous for you to take this medicine. Talk to your doctor about your risk of cancer. You may be more at risk for certain types of cancers if you take this medicine. Both men and women must use effective birth control with this medicine. Do not become  pregnant while taking this medicine or until at least 1 normal menstrual cycle has occurred after stopping it. Women should inform their doctor if they wish to become pregnant or think they might be pregnant. Men should not father a child while taking this medicine and for 3 months after stopping it. There is a potential for serious side effects to an unborn child. Talk to your health care professional or pharmacist for more information. Do not breast-feed an infant while taking this medicine. What side effects may I notice from receiving this medicine? Side effects that you should report  to your doctor or health care professional as soon as possible: -allergic reactions like skin rash, itching or hives, swelling of the face, lips, or tongue -back pain -breathing problems or shortness of breath -confusion -diarrhea -dry, nonproductive cough -low blood counts - this medicine may decrease the number of white blood cells, red blood cells and platelets. You may be at increased risk of infections and bleeding -mouth sores -redness, blistering, peeling or loosening of the skin, including inside the mouth -seizures -severe headaches -signs of infection - fever or chills, cough, sore throat, pain or difficulty passing urine -signs and symptoms of bleeding such as bloody or black, tarry stools; red or dark-brown urine; spitting up blood or brown material that looks like coffee grounds; red spots on the skin; unusual bruising or bleeding from the eye, gums, or nose -signs and symptoms of kidney injury like trouble passing urine or change in the amount of urine -signs and symptoms of liver injury like dark yellow or brown urine; general ill feeling or flu-like symptoms; light-colored stools; loss of appetite; nausea; right upper belly pain; unusually weak or tired; yellowing of the eyes or skin -stiff neck -vomiting Side effects that usually do not require medical attention (report to your doctor or health care professional if they continue or are bothersome): -dizziness -hair loss -headache -stomach pain -upset stomach This list may not describe all possible side effects. Call your doctor for medical advice about side effects. You may report side effects to FDA at 1-800-FDA-1088. Where should I keep my medicine? If you are using this medicine at home, you will be instructed on how to store this medicine. Throw away any unused medicine after the expiration date on the label. NOTE: This sheet is a summary. It may not cover all possible information. If you have questions about this  medicine, talk to your doctor, pharmacist, or health care provider.  2019 Elsevier/Gold Standard (2017-04-09 13:31:42)

## 2018-11-02 NOTE — MAU Note (Signed)
Pt in triage and phlebotomy at bedside to draw labs. Informed pt of plan of care. Pt verbalizes understanding

## 2018-11-02 NOTE — H&P (Signed)
33 year old G 3 P 2 at 6 w 1 day presents for management of ectopic pregnancy. She has had abnormally rising quants and ultrasound today shows free fluid and 17 mm x 9 mm cystic area in right adnexae After counseling in the office with patient and her husband she agrees to methotrexate injection today.  Quant yesterday 2111  Impression: Right ectopic pregnancy PLAN: Cbc, CMET Methotrexate today and return to office on Frday for repeat quant

## 2018-11-02 NOTE — MAU Note (Signed)
Nancy Boyer is a 33 y.o. here in MAU reporting: states she was sent by Dr Vincente Poli for MTX for ectopic pregnancy. No bleeding or pain.  LMP: 09/20/18  Pain score: 0/10  Vitals:   11/02/18 1352  BP: 116/75  Pulse: 85  Resp: 18  Temp: 98.6 F (37 C)  SpO2: 100%      Lab orders placed from triage: will call Dr Vincente Poli for blood and MTX orders

## 2018-11-05 DIAGNOSIS — N912 Amenorrhea, unspecified: Secondary | ICD-10-CM | POA: Diagnosis not present

## 2018-11-05 DIAGNOSIS — O009 Unspecified ectopic pregnancy without intrauterine pregnancy: Secondary | ICD-10-CM | POA: Diagnosis not present

## 2018-11-08 ENCOUNTER — Inpatient Hospital Stay (HOSPITAL_COMMUNITY): Payer: BLUE CROSS/BLUE SHIELD

## 2018-11-08 ENCOUNTER — Inpatient Hospital Stay (HOSPITAL_COMMUNITY)
Admission: AD | Admit: 2018-11-08 | Discharge: 2018-11-08 | Disposition: A | Discharge status: Home / Self Care | Payer: BLUE CROSS/BLUE SHIELD | Source: Ambulatory Visit | Attending: Obstetrics and Gynecology | Admitting: Obstetrics and Gynecology

## 2018-11-08 ENCOUNTER — Other Ambulatory Visit: Payer: Self-pay

## 2018-11-08 ENCOUNTER — Other Ambulatory Visit: Payer: Self-pay | Admitting: Obstetrics and Gynecology

## 2018-11-08 DIAGNOSIS — O00101 Right tubal pregnancy without intrauterine pregnancy: Secondary | ICD-10-CM

## 2018-11-08 DIAGNOSIS — O0990 Supervision of high risk pregnancy, unspecified, unspecified trimester: Secondary | ICD-10-CM | POA: Diagnosis not present

## 2018-11-08 DIAGNOSIS — Z3A00 Weeks of gestation of pregnancy not specified: Secondary | ICD-10-CM | POA: Diagnosis not present

## 2018-11-08 DIAGNOSIS — N83201 Unspecified ovarian cyst, right side: Secondary | ICD-10-CM | POA: Insufficient documentation

## 2018-11-08 DIAGNOSIS — O0910 Supervision of pregnancy with history of ectopic or molar pregnancy, unspecified trimester: Secondary | ICD-10-CM | POA: Diagnosis not present

## 2018-11-08 DIAGNOSIS — O009 Unspecified ectopic pregnancy without intrauterine pregnancy: Secondary | ICD-10-CM | POA: Diagnosis not present

## 2018-11-08 LAB — COMPREHENSIVE METABOLIC PANEL
ALT: 16 U/L (ref 0–44)
AST: 21 U/L (ref 15–41)
Albumin: 4 g/dL (ref 3.5–5.0)
Alkaline Phosphatase: 45 U/L (ref 38–126)
Anion gap: 5 (ref 5–15)
BUN: 9 mg/dL (ref 6–20)
CO2: 26 mmol/L (ref 22–32)
Calcium: 9 mg/dL (ref 8.9–10.3)
Chloride: 106 mmol/L (ref 98–111)
Creatinine, Ser: 0.74 mg/dL (ref 0.44–1.00)
GFR calc Af Amer: >60 mL/min (ref >60)
GFR calc non Af Amer: >60 mL/min (ref >60)
Glucose, Bld: 106 mg/dL — ABNORMAL HIGH (ref 70–99)
Potassium: 3.8 mmol/L (ref 3.5–5.1)
Sodium: 137 mmol/L (ref 135–145)
Total Bilirubin: 0.3 mg/dL (ref 0.3–1.2)
Total Protein: 6.6 g/dL (ref 6.5–8.1)

## 2018-11-08 LAB — CBC WITH DIFFERENTIAL/PLATELET
Abs Immature Granulocytes: 0.02 10*3/uL (ref 0.00–0.07)
Basophils Absolute: 0 10*3/uL (ref 0.0–0.1)
Basophils Relative: 0 %
EOS ABS: 0.2 10*3/uL (ref 0.0–0.5)
Eosinophils Relative: 2 %
HCT: 34.4 % — ABNORMAL LOW (ref 36.0–46.0)
Hemoglobin: 11.3 g/dL — ABNORMAL LOW (ref 12.0–15.0)
Immature Granulocytes: 0 %
Lymphocytes Relative: 36 %
Lymphs Abs: 2.6 10*3/uL (ref 0.7–4.0)
MCH: 28.6 pg (ref 26.0–34.0)
MCHC: 32.8 g/dL (ref 30.0–36.0)
MCV: 87.1 fL (ref 80.0–100.0)
Monocytes Absolute: 0.4 10*3/uL (ref 0.1–1.0)
Monocytes Relative: 6 %
NRBC: 0 % (ref 0.0–0.2)
Neutro Abs: 3.9 10*3/uL (ref 1.7–7.7)
Neutrophils Relative %: 56 %
Platelets: 238 10*3/uL (ref 150–400)
RBC: 3.95 MIL/uL (ref 3.87–5.11)
RDW: 12.2 % (ref 11.5–15.5)
WBC: 7.2 10*3/uL (ref 4.0–10.5)

## 2018-11-08 MED ORDER — HYOSCYAMINE SULFATE 0.125 MG SL SUBL
0.2500 mg | SUBLINGUAL_TABLET | Freq: Once | SUBLINGUAL | Status: AC
  Administered 2018-11-08: 0.25 mg via SUBLINGUAL
  Filled 2018-11-08: qty 2

## 2018-11-08 MED ORDER — DICYCLOMINE HCL 10 MG/5ML PO SOLN
10.0000 mg | Freq: Once | ORAL | Status: AC
  Administered 2018-11-08: 10 mg via ORAL
  Filled 2018-11-08: qty 5

## 2018-11-08 MED ORDER — ALUM & MAG HYDROXIDE-SIMETH 200-200-20 MG/5ML PO SUSP
30.0000 mL | Freq: Once | ORAL | Status: AC
  Administered 2018-11-08: 30 mL via ORAL
  Filled 2018-11-08: qty 30

## 2018-11-08 MED ORDER — METHOTREXATE FOR ECTOPIC PREGNANCY
50.0000 mg/m2 | Freq: Once | INTRAMUSCULAR | Status: AC
  Administered 2018-11-08: 80 mg via INTRAMUSCULAR
  Filled 2018-11-08: qty 1

## 2018-11-08 NOTE — Progress Notes (Signed)
Results for orders placed or performed during the hospital encounter of 11/08/18 (from the past 24 hour(s))  CBC WITH DIFFERENTIAL     Status: Abnormal   Collection Time: 11/08/18  5:47 PM  Result Value Ref Range   WBC 7.2 4.0 - 10.5 K/uL   RBC 3.95 3.87 - 5.11 MIL/uL   Hemoglobin 11.3 (L) 12.0 - 15.0 g/dL   HCT 29.2 (L) 44.6 - 28.6 %   MCV 87.1 80.0 - 100.0 fL   MCH 28.6 26.0 - 34.0 pg   MCHC 32.8 30.0 - 36.0 g/dL   RDW 38.1 77.1 - 16.5 %   Platelets 238 150 - 400 K/uL   nRBC 0.0 0.0 - 0.2 %   Neutrophils Relative % 56 %   Neutro Abs 3.9 1.7 - 7.7 K/uL   Lymphocytes Relative 36 %   Lymphs Abs 2.6 0.7 - 4.0 K/uL   Monocytes Relative 6 %   Monocytes Absolute 0.4 0.1 - 1.0 K/uL   Eosinophils Relative 2 %   Eosinophils Absolute 0.2 0.0 - 0.5 K/uL   Basophils Relative 0 %   Basophils Absolute 0.0 0.0 - 0.1 K/uL   Immature Granulocytes 0 %   Abs Immature Granulocytes 0.02 0.00 - 0.07 K/uL  Comprehensive metabolic panel     Status: Abnormal   Collection Time: 11/08/18  5:47 PM  Result Value Ref Range   Sodium 137 135 - 145 mmol/L   Potassium 3.8 3.5 - 5.1 mmol/L   Chloride 106 98 - 111 mmol/L   CO2 26 22 - 32 mmol/L   Glucose, Bld 106 (H) 70 - 99 mg/dL   BUN 9 6 - 20 mg/dL   Creatinine, Ser 7.90 0.44 - 1.00 mg/dL   Calcium 9.0 8.9 - 38.3 mg/dL   Total Protein 6.6 6.5 - 8.1 g/dL   Albumin 4.0 3.5 - 5.0 g/dL   AST 21 15 - 41 U/L   ALT 16 0 - 44 U/L   Alkaline Phosphatase 45 38 - 126 U/L   Total Bilirubin 0.3 0.3 - 1.2 mg/dL   GFR calc non Af Amer >60 >60 mL/min   GFR calc Af Amer >60 >60 mL/min   Anion gap 5 5 - 15   Will order GI cocktail to see if stomach discomfort improves She is currently in ultrasound

## 2018-11-08 NOTE — Discharge Instructions (Signed)
Follow up With Dr Vincente Poli on Thursday for hormone level check.

## 2018-11-08 NOTE — MAU Note (Signed)
Pt sent from office for dose 2 of METHOTREXATE.

## 2018-11-08 NOTE — MAU Note (Signed)
Having pain in LUQ, around to back , started 2 days ago, this is where she hurts with GI issues. Had some loose stools today.bleeding like a heavy period.

## 2018-11-08 NOTE — H&P (Signed)
33 year old G 3 P 2 who received first dose of methotrexate last week presents for second dose of methotrexate She came to office today - no sharp pain but has been having light bleeding since last week.   Quant HCG today is 3500s - rise of a little over 200 since Saturday. Given the failure to fall by Day 7 (today) I recommend 2nd dosage of Methotrexate today. LMP 09/20/2018   Impression: Right ectopic pregnancy  Status post Methotrexate 1 week ago and HCGs still not decreasing  PLAN: Recommend Repeat Methotrexate today. Follow up on Thursday for repeat HCG

## 2018-11-11 DIAGNOSIS — O009 Unspecified ectopic pregnancy without intrauterine pregnancy: Secondary | ICD-10-CM | POA: Diagnosis not present

## 2018-11-17 DIAGNOSIS — O009 Unspecified ectopic pregnancy without intrauterine pregnancy: Secondary | ICD-10-CM | POA: Diagnosis not present

## 2018-11-17 DIAGNOSIS — Z319 Encounter for procreative management, unspecified: Secondary | ICD-10-CM | POA: Diagnosis not present

## 2018-11-17 DIAGNOSIS — Z8759 Personal history of other complications of pregnancy, childbirth and the puerperium: Secondary | ICD-10-CM | POA: Diagnosis not present

## 2018-11-17 DIAGNOSIS — E288 Other ovarian dysfunction: Secondary | ICD-10-CM | POA: Diagnosis not present

## 2018-11-17 DIAGNOSIS — Z31430 Encounter of female for testing for genetic disease carrier status for procreative management: Secondary | ICD-10-CM | POA: Diagnosis not present

## 2018-12-30 DIAGNOSIS — N912 Amenorrhea, unspecified: Secondary | ICD-10-CM | POA: Diagnosis not present

## 2019-01-03 DIAGNOSIS — N912 Amenorrhea, unspecified: Secondary | ICD-10-CM | POA: Diagnosis not present

## 2019-01-20 DIAGNOSIS — N911 Secondary amenorrhea: Secondary | ICD-10-CM | POA: Diagnosis not present

## 2019-01-31 DIAGNOSIS — Z3685 Encounter for antenatal screening for Streptococcus B: Secondary | ICD-10-CM | POA: Diagnosis not present

## 2019-01-31 DIAGNOSIS — Z3481 Encounter for supervision of other normal pregnancy, first trimester: Secondary | ICD-10-CM | POA: Diagnosis not present

## 2019-01-31 DIAGNOSIS — Z3480 Encounter for supervision of other normal pregnancy, unspecified trimester: Secondary | ICD-10-CM | POA: Diagnosis not present

## 2019-01-31 LAB — OB RESULTS CONSOLE ABO/RH: RH Type: POSITIVE

## 2019-01-31 LAB — OB RESULTS CONSOLE HEPATITIS B SURFACE ANTIGEN: Hepatitis B Surface Ag: NEGATIVE

## 2019-01-31 LAB — OB RESULTS CONSOLE RUBELLA ANTIBODY, IGM: Rubella: IMMUNE

## 2019-01-31 LAB — OB RESULTS CONSOLE GC/CHLAMYDIA
Chlamydia: NEGATIVE
Gonorrhea: NEGATIVE

## 2019-01-31 LAB — OB RESULTS CONSOLE ANTIBODY SCREEN: Antibody Screen: NEGATIVE

## 2019-01-31 LAB — OB RESULTS CONSOLE RPR: RPR: NONREACTIVE

## 2019-01-31 LAB — OB RESULTS CONSOLE HIV ANTIBODY (ROUTINE TESTING): HIV: NONREACTIVE

## 2019-02-15 DIAGNOSIS — Z3400 Encounter for supervision of normal first pregnancy, unspecified trimester: Secondary | ICD-10-CM | POA: Diagnosis not present

## 2019-02-15 DIAGNOSIS — Z113 Encounter for screening for infections with a predominantly sexual mode of transmission: Secondary | ICD-10-CM | POA: Diagnosis not present

## 2019-03-01 DIAGNOSIS — Z3481 Encounter for supervision of other normal pregnancy, first trimester: Secondary | ICD-10-CM | POA: Diagnosis not present

## 2019-03-01 DIAGNOSIS — Z3A12 12 weeks gestation of pregnancy: Secondary | ICD-10-CM | POA: Diagnosis not present

## 2019-03-01 DIAGNOSIS — Z3682 Encounter for antenatal screening for nuchal translucency: Secondary | ICD-10-CM | POA: Diagnosis not present

## 2019-03-25 DIAGNOSIS — Z20818 Contact with and (suspected) exposure to other bacterial communicable diseases: Secondary | ICD-10-CM | POA: Diagnosis not present

## 2019-03-25 DIAGNOSIS — J029 Acute pharyngitis, unspecified: Secondary | ICD-10-CM | POA: Diagnosis not present

## 2019-04-18 DIAGNOSIS — Z363 Encounter for antenatal screening for malformations: Secondary | ICD-10-CM | POA: Diagnosis not present

## 2019-04-18 DIAGNOSIS — Z361 Encounter for antenatal screening for raised alphafetoprotein level: Secondary | ICD-10-CM | POA: Diagnosis not present

## 2019-04-18 DIAGNOSIS — Z3A19 19 weeks gestation of pregnancy: Secondary | ICD-10-CM | POA: Diagnosis not present

## 2019-04-18 DIAGNOSIS — Z3480 Encounter for supervision of other normal pregnancy, unspecified trimester: Secondary | ICD-10-CM | POA: Diagnosis not present

## 2019-04-21 DIAGNOSIS — Z20818 Contact with and (suspected) exposure to other bacterial communicable diseases: Secondary | ICD-10-CM | POA: Diagnosis not present

## 2019-05-18 DIAGNOSIS — N760 Acute vaginitis: Secondary | ICD-10-CM | POA: Diagnosis not present

## 2019-05-30 DIAGNOSIS — Z20818 Contact with and (suspected) exposure to other bacterial communicable diseases: Secondary | ICD-10-CM | POA: Diagnosis not present

## 2019-05-30 DIAGNOSIS — J029 Acute pharyngitis, unspecified: Secondary | ICD-10-CM | POA: Diagnosis not present

## 2019-06-13 DIAGNOSIS — Z361 Encounter for antenatal screening for raised alphafetoprotein level: Secondary | ICD-10-CM | POA: Diagnosis not present

## 2019-06-30 DIAGNOSIS — R1011 Right upper quadrant pain: Secondary | ICD-10-CM | POA: Diagnosis not present

## 2019-06-30 DIAGNOSIS — Z3480 Encounter for supervision of other normal pregnancy, unspecified trimester: Secondary | ICD-10-CM | POA: Diagnosis not present

## 2019-07-05 DIAGNOSIS — R5383 Other fatigue: Secondary | ICD-10-CM | POA: Diagnosis not present

## 2019-07-05 DIAGNOSIS — R52 Pain, unspecified: Secondary | ICD-10-CM | POA: Diagnosis not present

## 2019-07-05 DIAGNOSIS — J029 Acute pharyngitis, unspecified: Secondary | ICD-10-CM | POA: Diagnosis not present

## 2019-07-05 DIAGNOSIS — R0981 Nasal congestion: Secondary | ICD-10-CM | POA: Diagnosis not present

## 2019-07-08 ENCOUNTER — Other Ambulatory Visit (HOSPITAL_COMMUNITY): Payer: Self-pay | Admitting: Obstetrics and Gynecology

## 2019-07-08 ENCOUNTER — Other Ambulatory Visit: Payer: Self-pay | Admitting: Obstetrics and Gynecology

## 2019-07-08 DIAGNOSIS — R1012 Left upper quadrant pain: Secondary | ICD-10-CM

## 2019-07-12 DIAGNOSIS — Z131 Encounter for screening for diabetes mellitus: Secondary | ICD-10-CM | POA: Diagnosis not present

## 2019-07-12 DIAGNOSIS — Z136 Encounter for screening for cardiovascular disorders: Secondary | ICD-10-CM | POA: Diagnosis not present

## 2019-07-12 DIAGNOSIS — Z713 Dietary counseling and surveillance: Secondary | ICD-10-CM | POA: Diagnosis not present

## 2019-07-12 DIAGNOSIS — Z13220 Encounter for screening for lipoid disorders: Secondary | ICD-10-CM | POA: Diagnosis not present

## 2019-07-12 DIAGNOSIS — Z6824 Body mass index (BMI) 24.0-24.9, adult: Secondary | ICD-10-CM | POA: Diagnosis not present

## 2019-07-13 ENCOUNTER — Encounter (HOSPITAL_COMMUNITY): Payer: Self-pay

## 2019-07-13 ENCOUNTER — Ambulatory Visit (HOSPITAL_COMMUNITY): Payer: BLUE CROSS/BLUE SHIELD

## 2019-07-14 DIAGNOSIS — R1012 Left upper quadrant pain: Secondary | ICD-10-CM | POA: Diagnosis not present

## 2019-07-19 ENCOUNTER — Other Ambulatory Visit: Payer: Self-pay

## 2019-07-19 ENCOUNTER — Encounter (HOSPITAL_COMMUNITY): Payer: Self-pay

## 2019-07-19 ENCOUNTER — Ambulatory Visit (HOSPITAL_COMMUNITY): Admission: RE | Admit: 2019-07-19 | Payer: BC Managed Care – PPO | Source: Ambulatory Visit

## 2019-07-19 ENCOUNTER — Ambulatory Visit (HOSPITAL_COMMUNITY)
Admission: RE | Admit: 2019-07-19 | Discharge: 2019-07-19 | Disposition: A | Discharge status: Home / Self Care | Payer: BC Managed Care – PPO | Source: Ambulatory Visit | Attending: Obstetrics and Gynecology | Admitting: Obstetrics and Gynecology

## 2019-07-19 DIAGNOSIS — R1012 Left upper quadrant pain: Secondary | ICD-10-CM | POA: Diagnosis not present

## 2019-07-19 DIAGNOSIS — N1330 Unspecified hydronephrosis: Secondary | ICD-10-CM | POA: Diagnosis not present

## 2019-08-07 ENCOUNTER — Inpatient Hospital Stay (HOSPITAL_COMMUNITY)
Admission: AD | Admit: 2019-08-07 | Discharge: 2019-08-07 | Disposition: A | Discharge status: Home / Self Care | Payer: BC Managed Care – PPO | Attending: Obstetrics and Gynecology | Admitting: Obstetrics and Gynecology

## 2019-08-07 ENCOUNTER — Encounter (HOSPITAL_COMMUNITY): Payer: Self-pay

## 2019-08-07 DIAGNOSIS — Z3A35 35 weeks gestation of pregnancy: Secondary | ICD-10-CM | POA: Diagnosis not present

## 2019-08-07 DIAGNOSIS — N898 Other specified noninflammatory disorders of vagina: Secondary | ICD-10-CM | POA: Diagnosis not present

## 2019-08-07 DIAGNOSIS — O4703 False labor before 37 completed weeks of gestation, third trimester: Secondary | ICD-10-CM

## 2019-08-07 DIAGNOSIS — Z880 Allergy status to penicillin: Secondary | ICD-10-CM | POA: Diagnosis not present

## 2019-08-07 DIAGNOSIS — Z0371 Encounter for suspected problem with amniotic cavity and membrane ruled out: Secondary | ICD-10-CM | POA: Insufficient documentation

## 2019-08-07 DIAGNOSIS — Z8249 Family history of ischemic heart disease and other diseases of the circulatory system: Secondary | ICD-10-CM | POA: Insufficient documentation

## 2019-08-07 DIAGNOSIS — Z885 Allergy status to narcotic agent status: Secondary | ICD-10-CM | POA: Diagnosis not present

## 2019-08-07 LAB — POCT FERN TEST: POCT Fern Test: NEGATIVE

## 2019-08-07 NOTE — MAU Note (Signed)
Pt presents to MAU reporting leaking of fluid and CTX. She reports a gush of fluid at 0800 this morning and she has felt other gushes throughout the day and pelvic pain and pressure. +FM. No vaginal bleeding.

## 2019-08-07 NOTE — MAU Provider Note (Signed)
History     CSN: [id number]  Arrival date and time: 08/07/19 0004   None     Chief Complaint  Patient presents with  . Contractions  . Rupture of Membranes   HPI   Nancy Boyer is a 33 yo G4P2002 at 16.2 EGA who is presenting today concerned for SROM. She says that she saw a small wet spot in her underwear this afternoon. She subsequently put a pad on, and after a few hours it felt "moist" but not drenched.  She is feeling baby move, and is having contractions irregularly, once every 30 minutes. The pain is concentrated on the left side of her abdomen, and according to her OB her baby "is sitting on the left side of her uterus." She has not taken anything for the pain, and does not have a maternity belt.  OB History    Gravida  4   Para  2   Term  2   Preterm      AB      Living  2     SAB      TAB      Ectopic      Multiple  0   Live Births  2           Past Medical History:  Diagnosis Date  . Abdominal pain   . Allergic conjunctivitis and rhinitis   . Allergy   . Anal or rectal pain   . Anxiety   . Blood in stool   . Lactose intolerance   . Medical history non-contributory   . Migraine   . Nausea & vomiting   . NSVD (normal spontaneous vaginal delivery) 09/14/2013  . Pre-eclampsia   . Urticaria   . UTI (urinary tract infection)     Past Surgical History:  Procedure Laterality Date  . BREAST BIOPSY Right   . CYST REMOVAL HAND Left 2002   wrist  . TONSILLECTOMY  2010  . WISDOM TOOTH EXTRACTION  2003    Family History  Problem Relation Age of Onset  . Prostate cancer Paternal Grandfather   . Diabetes Paternal Grandfather   . Alzheimer's disease Paternal Grandfather   . Hypertension Mother   . Allergic rhinitis Mother   . Hypertension Father   . Diabetes Paternal Grandmother   . Diabetes Maternal Grandmother   . Stroke Maternal Grandmother   . Allergic rhinitis Sister   . Eczema Sister   . Asthma Sister   . Migraines Neg Hx   .  Angioedema Neg Hx   . Immunodeficiency Neg Hx   . Urticaria Neg Hx     Social History   Tobacco Use  . Smoking status: Never Smoker  . Smokeless tobacco: Never Used  Substance Use Topics  . Alcohol use: Not Currently    Comment: occ  . Drug use: No    Allergies:  Allergies  Allergen Reactions  . Penicillins Swelling and Rash    Has patient had a PCN reaction causing immediate rash, facial/tongue/throat swelling, SOB or lightheadedness with hypotension: Yes Has patient had a PCN reaction causing severe rash involving mucus membranes or skin necrosis: Yes Has patient had a PCN reaction that required hospitalization No Has patient had a PCN reaction occurring within the last 10 years: Yes If all of the above answers are "NO", then may proceed with Cephalosporin use.   . Codeine Nausea And Vomiting    Unknown childhood rxn    Medications Prior to Admission  Medication Sig  Dispense Refill Last Dose  . Prenatal Vit-Fe Fumarate-FA (PRENATAL MULTIVITAMIN) TABS tablet Take 1 tablet by mouth daily at 12 noon.   08/06/2019 at Unknown time  . valACYclovir (VALTREX) 500 MG tablet    Past Month at Unknown time  . triamcinolone cream (KENALOG) 0.1 % Apply 1 application topically 2 (two) times daily. 30 g 0     Review of Systems  All other systems reviewed and are negative.  Physical Exam   Blood pressure (!) 144/92, pulse 61, temperature 98.4 F (36.9 C), temperature source Oral, resp. rate 17, height 5\' 4"  (1.626 m), weight 68.6 kg, last menstrual period 09/20/2018, unknown if currently breastfeeding.  Physical Exam  Nursing note and vitals reviewed. Constitutional: She is oriented to person, place, and time. She appears well-developed and well-nourished.  HENT:  Head: Normocephalic and atraumatic.  Eyes: Pupils are equal, round, and reactive to light. Conjunctivae and EOM are normal.  Neck: Normal range of motion. Neck supple.  Cardiovascular: Normal rate, regular rhythm, normal  heart sounds and intact distal pulses.  Respiratory: Effort normal and breath sounds normal.  GI: Soft. Bowel sounds are normal.  No uterine contractions palpated, tenderness to palpation of the left lower uterus. Baby vertex by Leopold's.  Genitourinary:    Vagina and uterus normal.     No vaginal discharge.   Musculoskeletal: Normal range of motion.  Neurological: She is alert and oriented to person, place, and time. She has normal reflexes.  Skin: Skin is warm and dry.  Psychiatric: She has a normal mood and affect. Her behavior is normal. Judgment and thought content normal.    MAU Course  Procedures  MDM -Fern negative -No contractions on TOCO -Uterus not firm during "contractions"  NST -baseline: 135 -variability: moderate -accels: present -decels: absent -interpretation: reactive  Assessment and Plan  33 yo G4P2002 at 35.2 EGA here for labor check and r/o SROM. -Not in labor -Not ruptured -Recommend maternity belt and [employer] for optimal positioning -Recommend plenty of fluids and tylenol for pain -Follow up with OB as scheduled -Return precautions given  Thandiwe Siragusa L Caitland Porchia 08/07/2019, 1:15 AM

## 2019-08-07 NOTE — Discharge Instructions (Signed)
Preterm Labor and Birth Information °Pregnancy normally lasts 39-41 weeks. Preterm labor is when labor starts early. It starts before you have been pregnant for 37 whole weeks. °What are the risk factors for preterm labor? °Preterm labor is more likely to occur in women who: °· Have an infection while pregnant. °· Have a cervix that is short. °· Have gone into preterm labor before. °· Have had surgery on their cervix. °· Are younger than age 33. °· Are older than age 35. °· Are African American. °· Are pregnant with two or more babies. °· Take street drugs while pregnant. °· Smoke while pregnant. °· Do not gain enough weight while pregnant. °· Got pregnant right after another pregnancy. °What are the symptoms of preterm labor? °Symptoms of preterm labor include: °· Cramps. The cramps may feel like the cramps some women get during their period. The cramps may happen with watery poop (diarrhea). °· Pain in the belly (abdomen). °· Pain in the lower back. °· Regular contractions or tightening. It may feel like your belly is getting tighter. °· Pressure in the lower belly that seems to get stronger. °· More fluid (discharge) leaking from the vagina. The fluid may be watery or bloody. °· Water breaking. °Why is it important to notice signs of preterm labor? °Babies who are born early may not be fully developed. They have a higher chance for: °· Long-term heart problems. °· Long-term lung problems. °· Trouble controlling body systems, like breathing. °· Bleeding in the brain. °· A condition called cerebral palsy. °· Learning difficulties. °· Death. °These risks are highest for babies who are born before 34 weeks of pregnancy. °How is preterm labor treated? °Treatment depends on: °· How long you were pregnant. °· Your condition. °· The health of your baby. °Treatment may involve: °· Having a stitch (suture) placed in your cervix. When you give birth, your cervix opens so the baby can come out. The stitch keeps the cervix  from opening too soon. °· Staying at the hospital. °· Taking or getting medicines, such as: °? Hormone medicines. °? Medicines to stop contractions. °? Medicines to help the baby’s lungs develop. °? Medicines to prevent your baby from having cerebral palsy. °What should I do if I am in preterm labor? °If you think you are going into labor too soon, call your doctor right away. °How can I prevent preterm labor? °· Do not use any tobacco products. °? Examples of these are cigarettes, chewing tobacco, and e-cigarettes. °? If you need help quitting, ask your doctor. °· Do not use street drugs. °· Do not use any medicines unless you ask your doctor if they are safe for you. °· Talk with your doctor before taking any herbal supplements. °· Make sure you gain enough weight. °· Watch for infection. If you think you might have an infection, get it checked right away. °· If you have gone into preterm labor before, tell your doctor. °This information is not intended to replace advice given to you by your health care provider. Make sure you discuss any questions you have with your health care provider. °Document Released: 11/14/2008 Document Revised: 12/10/2018 Document Reviewed: 01/09/2016 °Elsevier Patient Education © 2020 Elsevier Inc. ° °

## 2019-08-09 DIAGNOSIS — Z03818 Encounter for observation for suspected exposure to other biological agents ruled out: Secondary | ICD-10-CM | POA: Diagnosis not present

## 2019-08-18 ENCOUNTER — Ambulatory Visit: Payer: BC Managed Care – PPO | Attending: Internal Medicine

## 2019-08-18 DIAGNOSIS — Z20822 Contact with and (suspected) exposure to covid-19: Secondary | ICD-10-CM

## 2019-08-18 DIAGNOSIS — Z20828 Contact with and (suspected) exposure to other viral communicable diseases: Secondary | ICD-10-CM | POA: Diagnosis not present

## 2019-08-19 LAB — NOVEL CORONAVIRUS, NAA: SARS-CoV-2, NAA: NOT DETECTED

## 2019-08-23 ENCOUNTER — Telehealth (HOSPITAL_COMMUNITY): Payer: Self-pay | Admitting: *Deleted

## 2019-08-23 NOTE — Telephone Encounter (Signed)
Preadmission screen  

## 2019-08-24 ENCOUNTER — Encounter (HOSPITAL_COMMUNITY): Payer: Self-pay | Admitting: *Deleted

## 2019-08-24 ENCOUNTER — Telehealth (HOSPITAL_COMMUNITY): Payer: Self-pay | Admitting: *Deleted

## 2019-08-24 NOTE — Telephone Encounter (Signed)
Preadmission screen  

## 2019-08-27 ENCOUNTER — Encounter (HOSPITAL_COMMUNITY): Payer: Self-pay | Admitting: Obstetrics and Gynecology

## 2019-08-27 ENCOUNTER — Inpatient Hospital Stay (HOSPITAL_COMMUNITY): Payer: BC Managed Care – PPO | Admitting: Anesthesiology

## 2019-08-27 ENCOUNTER — Inpatient Hospital Stay (HOSPITAL_COMMUNITY)
Admission: AD | Admit: 2019-08-27 | Discharge: 2019-08-28 | DRG: 806 | Disposition: A | Discharge status: Home / Self Care | Payer: BC Managed Care – PPO | Attending: Obstetrics and Gynecology | Admitting: Obstetrics and Gynecology

## 2019-08-27 ENCOUNTER — Other Ambulatory Visit (HOSPITAL_COMMUNITY): Payer: BC Managed Care – PPO

## 2019-08-27 ENCOUNTER — Other Ambulatory Visit: Payer: Self-pay

## 2019-08-27 DIAGNOSIS — O99892 Other specified diseases and conditions complicating childbirth: Secondary | ICD-10-CM | POA: Diagnosis not present

## 2019-08-27 DIAGNOSIS — N1330 Unspecified hydronephrosis: Secondary | ICD-10-CM | POA: Diagnosis not present

## 2019-08-27 DIAGNOSIS — O139 Gestational [pregnancy-induced] hypertension without significant proteinuria, unspecified trimester: Secondary | ICD-10-CM | POA: Diagnosis present

## 2019-08-27 DIAGNOSIS — Z3A38 38 weeks gestation of pregnancy: Secondary | ICD-10-CM | POA: Diagnosis not present

## 2019-08-27 DIAGNOSIS — Z20828 Contact with and (suspected) exposure to other viral communicable diseases: Secondary | ICD-10-CM | POA: Diagnosis not present

## 2019-08-27 DIAGNOSIS — O134 Gestational [pregnancy-induced] hypertension without significant proteinuria, complicating childbirth: Principal | ICD-10-CM | POA: Diagnosis present

## 2019-08-27 DIAGNOSIS — O164 Unspecified maternal hypertension, complicating childbirth: Secondary | ICD-10-CM | POA: Diagnosis not present

## 2019-08-27 LAB — CBC
HCT: 36.3 % (ref 36.0–46.0)
Hemoglobin: 12.4 g/dL (ref 12.0–15.0)
MCH: 29.5 pg (ref 26.0–34.0)
MCHC: 34.2 g/dL (ref 30.0–36.0)
MCV: 86.4 fL (ref 80.0–100.0)
Platelets: 221 10*3/uL (ref 150–400)
RBC: 4.2 MIL/uL (ref 3.87–5.11)
RDW: 12.6 % (ref 11.5–15.5)
WBC: 9.7 10*3/uL (ref 4.0–10.5)
nRBC: 0 % (ref 0.0–0.2)

## 2019-08-27 LAB — PROTEIN / CREATININE RATIO, URINE
Creatinine, Urine: 20.06 mg/dL
Total Protein, Urine: <6 mg/dL

## 2019-08-27 LAB — COMPREHENSIVE METABOLIC PANEL
ALT: 14 U/L (ref 0–44)
AST: 25 U/L (ref 15–41)
Albumin: 2.8 g/dL — ABNORMAL LOW (ref 3.5–5.0)
Alkaline Phosphatase: 145 U/L — ABNORMAL HIGH (ref 38–126)
Anion gap: 7 (ref 5–15)
BUN: 15 mg/dL (ref 6–20)
CO2: 20 mmol/L — ABNORMAL LOW (ref 22–32)
Calcium: 8.5 mg/dL — ABNORMAL LOW (ref 8.9–10.3)
Chloride: 109 mmol/L (ref 98–111)
Creatinine, Ser: 0.61 mg/dL (ref 0.44–1.00)
GFR calc Af Amer: >60 mL/min (ref >60)
GFR calc non Af Amer: >60 mL/min (ref >60)
Glucose, Bld: 79 mg/dL (ref 70–99)
Potassium: 4.3 mmol/L (ref 3.5–5.1)
Sodium: 136 mmol/L (ref 135–145)
Total Bilirubin: <0.1 mg/dL — ABNORMAL LOW (ref 0.3–1.2)
Total Protein: 5.9 g/dL — ABNORMAL LOW (ref 6.5–8.1)

## 2019-08-27 LAB — POCT FERN TEST: POCT Fern Test: NEGATIVE

## 2019-08-27 LAB — AMNISURE RUPTURE OF MEMBRANE (ROM) NOT AT ARMC: Amnisure ROM: NEGATIVE

## 2019-08-27 LAB — TYPE AND SCREEN
ABO/RH(D): B POS
Antibody Screen: NEGATIVE

## 2019-08-27 LAB — RPR: RPR Ser Ql: NONREACTIVE

## 2019-08-27 LAB — ABO/RH: ABO/RH(D): B POS

## 2019-08-27 LAB — SARS CORONAVIRUS 2 (TAT 6-24 HRS): SARS Coronavirus 2: NEGATIVE

## 2019-08-27 MED ORDER — LACTATED RINGERS IV SOLN: 500.0000 mL | Freq: Once | INTRAVENOUS | Status: DC

## 2019-08-27 MED ORDER — SODIUM CHLORIDE (PF) 0.9 % IJ SOLN
INTRAMUSCULAR | Status: DC | PRN
  Administered 2019-08-27: 12 mL/h via EPIDURAL

## 2019-08-27 MED ORDER — COCONUT OIL OIL: 1.0000 "application " | TOPICAL_OIL | Status: DC | PRN

## 2019-08-27 MED ORDER — IBUPROFEN 600 MG PO TABS
600.0000 mg | ORAL_TABLET | Freq: Four times a day (QID) | ORAL | Status: DC
  Administered 2019-08-27 – 2019-08-28 (×4): 600 mg via ORAL
  Filled 2019-08-27 (×4): qty 1

## 2019-08-27 MED ORDER — LIDOCAINE HCL (PF) 1 % IJ SOLN
30.0000 mL | INTRAMUSCULAR | Status: AC | PRN
  Administered 2019-08-27: 5 mL via SUBCUTANEOUS

## 2019-08-27 MED ORDER — FENTANYL-BUPIVACAINE-NACL 0.5-0.125-0.9 MG/250ML-% EP SOLN: 12.0000 mL/h | EPIDURAL | Status: DC | PRN

## 2019-08-27 MED ORDER — FENTANYL-BUPIVACAINE-NACL 0.5-0.125-0.9 MG/250ML-% EP SOLN
12.0000 mL/h | EPIDURAL | Status: DC | PRN
  Filled 2019-08-27: qty 250

## 2019-08-27 MED ORDER — PHENYLEPHRINE 40 MCG/ML (10ML) SYRINGE FOR IV PUSH (FOR BLOOD PRESSURE SUPPORT): 80.0000 ug | PREFILLED_SYRINGE | INTRAVENOUS | Status: DC | PRN

## 2019-08-27 MED ORDER — ACETAMINOPHEN 325 MG PO TABS: 650.0000 mg | ORAL_TABLET | ORAL | Status: DC | PRN

## 2019-08-27 MED ORDER — DIPHENHYDRAMINE HCL 25 MG PO CAPS: 25.0000 mg | ORAL_CAPSULE | Freq: Four times a day (QID) | ORAL | Status: DC | PRN

## 2019-08-27 MED ORDER — LACTATED RINGERS IV SOLN: INTRAVENOUS | Status: DC

## 2019-08-27 MED ORDER — BENZOCAINE-MENTHOL 20-0.5 % EX AERO: 1.0000 "application " | INHALATION_SPRAY | CUTANEOUS | Status: DC | PRN

## 2019-08-27 MED ORDER — TERBUTALINE SULFATE 1 MG/ML IJ SOLN: 0.2500 mg | Freq: Once | INTRAMUSCULAR | Status: DC | PRN

## 2019-08-27 MED ORDER — FENTANYL CITRATE (PF) 100 MCG/2ML IJ SOLN
50.0000 ug | INTRAMUSCULAR | Status: DC | PRN
  Administered 2019-08-27: 13:00:00 50 ug via INTRAVENOUS
  Administered 2019-08-27: 100 ug via INTRAVENOUS
  Administered 2019-08-27: 50 ug via INTRAVENOUS
  Filled 2019-08-27 (×2): qty 2

## 2019-08-27 MED ORDER — DIBUCAINE (PERIANAL) 1 % EX OINT: 1.0000 "application " | TOPICAL_OINTMENT | CUTANEOUS | Status: DC | PRN

## 2019-08-27 MED ORDER — ZOLPIDEM TARTRATE 5 MG PO TABS: 5.0000 mg | ORAL_TABLET | Freq: Every evening | ORAL | Status: DC | PRN

## 2019-08-27 MED ORDER — ONDANSETRON HCL 4 MG/2ML IJ SOLN: 4.0000 mg | Freq: Four times a day (QID) | INTRAMUSCULAR | Status: DC | PRN

## 2019-08-27 MED ORDER — SIMETHICONE 80 MG PO CHEW: 80.0000 mg | CHEWABLE_TABLET | ORAL | Status: DC | PRN

## 2019-08-27 MED ORDER — OXYTOCIN BOLUS FROM INFUSION
500.0000 mL | Freq: Once | INTRAVENOUS | Status: AC
  Administered 2019-08-27: 500 mL via INTRAVENOUS

## 2019-08-27 MED ORDER — BUPIVACAINE HCL (PF) 0.25 % IJ SOLN
INTRAMUSCULAR | Status: DC | PRN
  Administered 2019-08-27 (×2): 5 mL via EPIDURAL

## 2019-08-27 MED ORDER — DIPHENHYDRAMINE HCL 50 MG/ML IJ SOLN
12.5000 mg | INTRAMUSCULAR | Status: DC | PRN
  Administered 2019-08-27: 12.5 mg via INTRAVENOUS
  Filled 2019-08-27: qty 1

## 2019-08-27 MED ORDER — FLEET ENEMA 7-19 GM/118ML RE ENEM: 1.0000 | ENEMA | RECTAL | Status: DC | PRN

## 2019-08-27 MED ORDER — SOD CITRATE-CITRIC ACID 500-334 MG/5ML PO SOLN: 30.0000 mL | ORAL | Status: DC | PRN

## 2019-08-27 MED ORDER — LACTATED RINGERS IV SOLN: 500.0000 mL | INTRAVENOUS | Status: DC | PRN

## 2019-08-27 MED ORDER — ONDANSETRON HCL 4 MG/2ML IJ SOLN: 4.0000 mg | INTRAMUSCULAR | Status: DC | PRN

## 2019-08-27 MED ORDER — LACTATED RINGERS IV SOLN
500.0000 mL | Freq: Once | INTRAVENOUS | Status: AC
  Administered 2019-08-27: 500 mL via INTRAVENOUS

## 2019-08-27 MED ORDER — ONDANSETRON HCL 4 MG PO TABS: 4.0000 mg | ORAL_TABLET | ORAL | Status: DC | PRN

## 2019-08-27 MED ORDER — EPHEDRINE 5 MG/ML INJ: 10.0000 mg | INTRAVENOUS | Status: DC | PRN

## 2019-08-27 MED ORDER — TETANUS-DIPHTH-ACELL PERTUSSIS 5-2.5-18.5 LF-MCG/0.5 IM SUSP: 0.5000 mL | Freq: Once | INTRAMUSCULAR | Status: DC

## 2019-08-27 MED ORDER — OXYCODONE-ACETAMINOPHEN 5-325 MG PO TABS: 2.0000 | ORAL_TABLET | ORAL | Status: DC | PRN

## 2019-08-27 MED ORDER — OXYCODONE-ACETAMINOPHEN 5-325 MG PO TABS: 1.0000 | ORAL_TABLET | ORAL | Status: DC | PRN

## 2019-08-27 MED ORDER — MEDROXYPROGESTERONE ACETATE 150 MG/ML IM SUSP: 150.0000 mg | INTRAMUSCULAR | Status: DC | PRN

## 2019-08-27 MED ORDER — OXYTOCIN 40 UNITS IN NORMAL SALINE INFUSION - SIMPLE MED: 2.5000 [IU]/h | INTRAVENOUS | Status: DC

## 2019-08-27 MED ORDER — ACETAMINOPHEN 325 MG PO TABS
650.0000 mg | ORAL_TABLET | ORAL | Status: DC | PRN
  Administered 2019-08-27 – 2019-08-28 (×2): 650 mg via ORAL
  Filled 2019-08-27 (×2): qty 2

## 2019-08-27 MED ORDER — MEASLES, MUMPS & RUBELLA VAC IJ SOLR: 0.5000 mL | Freq: Once | INTRAMUSCULAR | Status: DC

## 2019-08-27 MED ORDER — OXYTOCIN 40 UNITS IN NORMAL SALINE INFUSION - SIMPLE MED
1.0000 m[IU]/min | INTRAVENOUS | Status: DC
  Administered 2019-08-27: 2 m[IU]/min via INTRAVENOUS
  Filled 2019-08-27: qty 1000

## 2019-08-27 MED ORDER — WITCH HAZEL-GLYCERIN EX PADS: 1.0000 "application " | MEDICATED_PAD | CUTANEOUS | Status: DC | PRN

## 2019-08-27 MED ORDER — OXYTOCIN BOLUS FROM INFUSION: 500.0000 mL | Freq: Once | INTRAVENOUS | Status: DC

## 2019-08-27 MED ORDER — LIDOCAINE HCL (PF) 1 % IJ SOLN: 30.0000 mL | INTRAMUSCULAR | Status: DC | PRN

## 2019-08-27 MED ORDER — SENNOSIDES-DOCUSATE SODIUM 8.6-50 MG PO TABS
2.0000 | ORAL_TABLET | ORAL | Status: DC
  Administered 2019-08-28: 2 via ORAL
  Filled 2019-08-27: qty 2

## 2019-08-27 MED ORDER — PRENATAL MULTIVITAMIN CH
1.0000 | ORAL_TABLET | Freq: Every day | ORAL | Status: DC
  Administered 2019-08-28: 1 via ORAL
  Filled 2019-08-27: qty 1

## 2019-08-27 MED ORDER — VANCOMYCIN HCL IN DEXTROSE 1-5 GM/200ML-% IV SOLN
1000.0000 mg | Freq: Two times a day (BID) | INTRAVENOUS | Status: DC
  Administered 2019-08-27: 1000 mg via INTRAVENOUS
  Filled 2019-08-27: qty 200

## 2019-08-27 NOTE — MAU Note (Signed)
Pt reports to MAU stating she woke up from her sleep with a sharp abdominal pain. Pt also had a gush of fluid and she is unsure if it is her water. Pt reports DFM. No bleeding.

## 2019-08-27 NOTE — Lactation Note (Signed)
Lactation Consultation Note  Patient Name: Nancy Boyer Date: 08/27/2019   Mom's RN notified lactation staff that mom states she is an experienced breastfeeding mom and desires to not have lactation visit her during her hospital stay.         Maternal Data    Feeding    LATCH Score                   Interventions    Lactation Tools Discussed/Used WIC Program: No   Consult Status      Ferne Coe [facility] 08/27/2019, 4:44 PM

## 2019-08-27 NOTE — H&P (Signed)
Nancy Boyer is a 33 y.o. female presenting for Labor sxs.  Hx of left hydronephrosis during pregnancy and pain, and hx of severe pelvic pain during pregancy (ostits pubis).  Has had elevated BPs in office c/w gestational HTN and scheduled for IOL next week  No severe features at this time.  GBS unknown. OB History    Gravida  4   Para  2   Term  2   Preterm      AB      Living  2     SAB      TAB      Ectopic      Multiple  0   Live Births  2          Past Medical History:  Diagnosis Date  . Abdominal pain   . Allergic conjunctivitis and rhinitis   . Allergy   . Anal or rectal pain   . Blood in stool   . Lactose intolerance   . Medical history non-contributory   . Migraine   . Nausea & vomiting   . NSVD (normal spontaneous vaginal delivery) 09/14/2013  . Pre-eclampsia   . Pregnancy induced hypertension   . Urticaria   . UTI (urinary tract infection)    Past Surgical History:  Procedure Laterality Date  . BREAST BIOPSY Right   . CYST REMOVAL HAND Left 2002   wrist  . TONSILLECTOMY  2010  . WISDOM TOOTH EXTRACTION  2003   Family History: family history includes Allergic rhinitis in her mother and sister; Alzheimer's disease in her paternal grandfather; Asthma in her sister; Diabetes in her maternal grandmother, paternal grandfather, and paternal grandmother; Eczema in her sister; Hypertension in her father and mother; Prostate cancer in her paternal grandfather; Stroke in her maternal grandmother. Social History:  reports that she has never smoked. She has never used smokeless tobacco. She reports previous alcohol use. She reports that she does not use drugs.     Maternal Diabetes: No Genetic Screening: Normal Maternal Ultrasounds/Referrals: Normal Fetal Ultrasounds or other Referrals:  None Maternal Substance Abuse:  No Significant Maternal Medications:  None Significant Maternal Lab Results:  None Other Comments:  None  Review of  Systems History Dilation: 4 Effacement (%): 60 Station: -1, -2 Exam by:: m wilkins rnc Blood pressure (!) 145/85, pulse (!) 55, temperature 98.4 F (36.9 C), temperature source Oral, resp. rate 16, last menstrual period 09/20/2018, unknown if currently breastfeeding. Exam Physical Exam  Prenatal labs: ABO, Rh: --/--/B POS (12/26 3300) Antibody: NEG (12/26 7622) Rubella: Immune (06/01 0000) RPR: Nonreactive (06/01 0000)  HBsAg: Negative (06/01 0000)  HIV: Non-reactive (06/01 0000)  GBS:     Assessment/Plan: IUP at term Gestational HTN without severe features Labor - AROM and anticipate svd ABx for unknown GBS (vanc) Anticipate SVD   Luz Lex 08/27/2019, 9:02 AM

## 2019-08-27 NOTE — Anesthesia Procedure Notes (Signed)
Epidural Patient location during procedure: OB Start time: 08/27/2019 9:22 AM End time: 08/27/2019 9:28 AM  Staffing Anesthesiologist: Effie Berkshire, MD Performed: anesthesiologist   Preanesthetic Checklist Completed: patient identified, IV checked, site marked, risks and benefits discussed, surgical consent, monitors and equipment checked, pre-op evaluation and timeout performed  Epidural Patient position: sitting Prep: ChloraPrep Patient monitoring: heart rate, continuous pulse ox and blood pressure Approach: midline Location: L3-L4 Injection technique: LOR saline  Needle:  Needle type: Tuohy  Needle gauge: 17 G Needle length: 9 cm Catheter type: closed end flexible Catheter size: 20 Guage Test dose: negative and 1.5% lidocaine  Assessment Events: blood not aspirated, injection not painful, no injection resistance and no paresthesia  Additional Notes LOR @ 4  Patient identified. Risks/Benefits/Options discussed with patient including but not limited to bleeding, infection, nerve damage, paralysis, failed block, incomplete pain control, headache, blood pressure changes, nausea, vomiting, reactions to medications, itching and postpartum back pain. Confirmed with bedside nurse the patient's most recent platelet count. Confirmed with patient that they are not currently taking any anticoagulation, have any bleeding history or any family history of bleeding disorders. Patient expressed understanding and wished to proceed. All questions were answered. Sterile technique was used throughout the entire procedure. Please see nursing notes for vital signs. Test dose was given through epidural catheter and negative prior to continuing to dose epidural or start infusion. Warning signs of high block given to the patient including shortness of breath, tingling/numbness in hands, complete motor block, or any concerning symptoms with instructions to call for help. Patient was given instructions  on fall risk and not to get out of bed. All questions and concerns addressed with instructions to call with any issues or inadequate analgesia.    Reason for block:procedure for pain

## 2019-08-27 NOTE — Progress Notes (Signed)
Asked by RN to evaluate fetal position due to difficulty with SVE.  Baby is vertex by SVE: 3/50/-2, cervix posterior and moderately soft.  Merilyn Baba, DO OB Fellow, [facility] 08/27/2019 6:36 AM

## 2019-08-27 NOTE — Anesthesia Preprocedure Evaluation (Signed)
Anesthesia Evaluation  Patient identified by MRN, date of birth, ID band Patient awake    Reviewed: Allergy & Precautions, Patient's Chart, lab work & pertinent test results  Airway Mallampati: I       Dental   Pulmonary    Pulmonary exam normal        Cardiovascular hypertension, Normal cardiovascular exam     Neuro/Psych  Headaches,    GI/Hepatic negative GI ROS,   Endo/Other    Renal/GU      Musculoskeletal   Abdominal   Peds  Hematology   Anesthesia Other Findings   Reproductive/Obstetrics (+) Pregnancy                             Anesthesia Physical Anesthesia Plan  ASA: II  Anesthesia Plan: Epidural   Post-op Pain Management:    Induction:   PONV Risk Score and Plan:   Airway Management Planned: Natural Airway  Additional Equipment: None  Intra-op Plan:   Post-operative Plan:   Informed Consent: I have reviewed the patients History and Physical, chart, labs and discussed the procedure including the risks, benefits and alternatives for the proposed anesthesia with the patient or authorized representative who has indicated his/her understanding and acceptance.       Plan Discussed with:   Anesthesia Plan Comments: (Lab Results      Component                Value               Date                      WBC                      9.7                 08/27/2019                HGB                      12.4                08/27/2019                HCT                      36.3                08/27/2019                MCV                      86.4                08/27/2019                PLT                      221                 08/27/2019           )        Anesthesia Quick Evaluation

## 2019-08-27 NOTE — Progress Notes (Addendum)
Allergic reaction to vancomycin; skin hot to touch. Generalized itching. Swelling of lips.no change in LOC. No SOB.No change in vs. Dr Smith Robert at bedside.

## 2019-08-28 LAB — CBC
HCT: 33.5 % — ABNORMAL LOW (ref 36.0–46.0)
Hemoglobin: 10.9 g/dL — ABNORMAL LOW (ref 12.0–15.0)
MCH: 29.1 pg (ref 26.0–34.0)
MCHC: 32.5 g/dL (ref 30.0–36.0)
MCV: 89.3 fL (ref 80.0–100.0)
Platelets: 202 10*3/uL (ref 150–400)
RBC: 3.75 MIL/uL — ABNORMAL LOW (ref 3.87–5.11)
RDW: 13 % (ref 11.5–15.5)
WBC: 9.6 10*3/uL (ref 4.0–10.5)
nRBC: 0 % (ref 0.0–0.2)

## 2019-08-28 MED ORDER — IBUPROFEN 600 MG PO TABS: 600.0000 mg | ORAL_TABLET | Freq: Four times a day (QID) | ORAL | 0 refills | Status: DC | PRN

## 2019-08-28 NOTE — Discharge Summary (Signed)
Obstetric Discharge Summary Reason for Admission: onset of labor Prenatal Procedures: none Intrapartum Procedures: spontaneous vaginal delivery Postpartum Procedures: none Complications-Operative and Postpartum: none Hemoglobin  Date Value Ref Range Status  08/28/2019 10.9 (L) 12.0 - 15.0 g/dL Final  07/21/2018 12.7 11.1 - 15.9 g/dL Final   HCT  Date Value Ref Range Status  08/28/2019 33.5 (L) 36.0 - 46.0 % Final   Hematocrit  Date Value Ref Range Status  07/21/2018 39.1 34.0 - 46.6 % Final    Physical Exam:  General: alert, cooperative, appears stated age and no distress Lochia: appropriate Uterine Fundus: firm Incision: healing well DVT Evaluation: No evidence of DVT seen on physical exam.  Discharge Diagnoses: Term Pregnancy-delivered and Left flank pain due to hydronephrosis  Discharge Information: Date: 08/28/2019 Activity: pelvic rest Diet: routine Medications: PNV and Ibuprofen Condition: stable Instructions: refer to practice specific booklet Discharge to: home   Newborn Data: Live born female  Birth Weight: 6 lb 2.4 oz (2790 g) APGAR: 8, 9  Newborn Delivery   Birth date/time: 08/27/2019 13:31:00 Delivery type: Vaginal, Spontaneous      Home with mother.  Luz Lex 08/28/2019, 9:31 AM

## 2019-08-28 NOTE — Anesthesia Postprocedure Evaluation (Signed)
Anesthesia Post Note  Patient: Nancy Boyer  Procedure(s) Performed: AN AD HOC LABOR EPIDURAL     Patient location during evaluation: Mother Baby Anesthesia Type: Epidural Level of consciousness: awake Pain management: satisfactory to patient Vital Signs Assessment: post-procedure vital signs reviewed and stable Respiratory status: spontaneous breathing Cardiovascular status: stable Anesthetic complications: no    Last Vitals:  Vitals:   08/28/19 0054 08/28/19 0500  BP: (!) 136/94 (!) 158/94  Pulse: 69 60  Resp: 16 18  Temp: 36.6 C 36.6 C  SpO2: 98% 97%    Last Pain:  Vitals:   08/28/19 0700  TempSrc:   PainSc: 0-No pain   Pain Goal: Patients Stated Pain Goal: 0 (08/27/19 0720)                 Casimer Lanius

## 2019-08-29 ENCOUNTER — Inpatient Hospital Stay (HOSPITAL_COMMUNITY)
Admission: AD | Admit: 2019-08-29 | Payer: BC Managed Care – PPO | Source: Home / Self Care | Admitting: Obstetrics & Gynecology

## 2019-08-29 ENCOUNTER — Inpatient Hospital Stay (HOSPITAL_COMMUNITY): Payer: BC Managed Care – PPO

## 2019-08-30 DIAGNOSIS — Z20818 Contact with and (suspected) exposure to other bacterial communicable diseases: Secondary | ICD-10-CM | POA: Diagnosis not present

## 2019-08-30 DIAGNOSIS — Z20828 Contact with and (suspected) exposure to other viral communicable diseases: Secondary | ICD-10-CM | POA: Diagnosis not present

## 2019-08-30 DIAGNOSIS — U071 COVID-19: Secondary | ICD-10-CM | POA: Diagnosis not present

## 2019-11-23 DIAGNOSIS — K921 Melena: Secondary | ICD-10-CM | POA: Diagnosis not present

## 2019-11-23 DIAGNOSIS — Z01419 Encounter for gynecological examination (general) (routine) without abnormal findings: Secondary | ICD-10-CM | POA: Diagnosis not present

## 2019-11-23 DIAGNOSIS — Z6822 Body mass index (BMI) 22.0-22.9, adult: Secondary | ICD-10-CM | POA: Diagnosis not present

## 2019-11-28 ENCOUNTER — Encounter: Payer: Self-pay | Admitting: Gastroenterology

## 2019-12-12 ENCOUNTER — Ambulatory Visit: Payer: BC Managed Care – PPO | Admitting: Gastroenterology

## 2019-12-15 ENCOUNTER — Ambulatory Visit (INDEPENDENT_AMBULATORY_CARE_PROVIDER_SITE_OTHER): Payer: BC Managed Care – PPO | Admitting: Gastroenterology

## 2019-12-15 ENCOUNTER — Encounter: Payer: Self-pay | Admitting: Gastroenterology

## 2019-12-15 ENCOUNTER — Other Ambulatory Visit (INDEPENDENT_AMBULATORY_CARE_PROVIDER_SITE_OTHER): Payer: BC Managed Care – PPO

## 2019-12-15 VITALS — BP 118/70 | HR 82 | Temp 98.3°F | Ht 64.0 in | Wt 135.0 lb

## 2019-12-15 DIAGNOSIS — R194 Change in bowel habit: Secondary | ICD-10-CM | POA: Diagnosis not present

## 2019-12-15 DIAGNOSIS — K625 Hemorrhage of anus and rectum: Secondary | ICD-10-CM

## 2019-12-15 DIAGNOSIS — R1084 Generalized abdominal pain: Secondary | ICD-10-CM | POA: Diagnosis not present

## 2019-12-15 LAB — CBC WITH DIFFERENTIAL/PLATELET
Basophils Absolute: 0.1 10*3/uL (ref 0.0–0.1)
Basophils Relative: 0.7 % (ref 0.0–3.0)
Eosinophils Absolute: 0.4 10*3/uL (ref 0.0–0.7)
Eosinophils Relative: 4.7 % (ref 0.0–5.0)
HCT: 36.7 % (ref 36.0–46.0)
Hemoglobin: 12.3 g/dL (ref 12.0–15.0)
Lymphocytes Relative: 30.2 % (ref 12.0–46.0)
Lymphs Abs: 2.5 10*3/uL (ref 0.7–4.0)
MCHC: 33.4 g/dL (ref 30.0–36.0)
MCV: 87.8 fl (ref 78.0–100.0)
Monocytes Absolute: 0.6 10*3/uL (ref 0.1–1.0)
Monocytes Relative: 6.9 % (ref 3.0–12.0)
Neutro Abs: 4.7 10*3/uL (ref 1.4–7.7)
Neutrophils Relative %: 57.5 % (ref 43.0–77.0)
Platelets: 236 10*3/uL (ref 150.0–400.0)
RBC: 4.18 Mil/uL (ref 3.87–5.11)
RDW: 12.5 % (ref 11.5–15.5)
WBC: 8.3 10*3/uL (ref 4.0–10.5)

## 2019-12-15 LAB — TSH: TSH: 1.23 u[IU]/mL (ref 0.35–4.50)

## 2019-12-15 LAB — SEDIMENTATION RATE: Sed Rate: 12 mm/hr (ref 0–20)

## 2019-12-15 LAB — C-REACTIVE PROTEIN: CRP: <1 mg/dL (ref 0.5–20.0)

## 2019-12-15 NOTE — Patient Instructions (Signed)
If you are age 34 or older, your body mass index should be between 23-30. Your Body mass index is 23.17 kg/m. If this is out of the aforementioned range listed, please consider follow up with your Primary Care Provider.  If you are age 62 or younger, your body mass index should be between 19-25. Your Body mass index is 23.17 kg/m. If this is out of the aformentioned range listed, please consider follow up with your Primary Care Provider.   Your provider has requested that you go to the basement level for lab work before leaving today. Press "B" on the elevator. The lab is located at the first door on the left as you exit the elevator.  Benefiber 1-2 tablespoons once daily may increase to twice daily as needed.  You have been scheduled for a CT scan of the abdomen and pelvis at [facility] ([address]---this is in the same building as [employer]).   You are scheduled on Wednesday 12/21/19 at 9 am. You should arrive 15 minutes prior to your appointment time for registration. Please follow the written instructions below on the day of your exam:  WARNING: IF YOU ARE ALLERGIC TO IODINE/X-RAY DYE, PLEASE NOTIFY RADIOLOGY IMMEDIATELY AT [phone]! YOU WILL BE GIVEN A 13 HOUR PREMEDICATION PREP.  1) Do not eat or drink anything after 5 am (4 hours prior to your test) 2) You have been given 2 bottles of oral contrast to drink. The solution may taste better if refrigerated, but do NOT add ice or any other liquid to this solution. Shake well before drinking.    Drink 1 bottle of contrast @ 7 am (2 hours prior to your exam)  Drink 1 bottle of contrast @ 8 am (1 hour prior to your exam)  You may take any medications as prescribed with a small amount of water, if necessary. If you take any of the following medications: METFORMIN, GLUCOPHAGE, GLUCOVANCE, AVANDAMET, RIOMET, FORTAMET, [place] MET, JANUMET, GLUMETZA or METAGLIP, you MAY be asked to HOLD this medication 48 hours  AFTER the exam.  The purpose of you drinking the oral contrast is to aid in the visualization of your intestinal tract. The contrast solution may cause some diarrhea. Depending on your individual set of symptoms, you may also receive an intravenous injection of x-ray contrast/dye. Plan on being at [facility] for 30 minutes or longer, depending on the type of exam you are having performed.  This test typically takes 30-45 minutes to complete.  If you have any questions regarding your exam or if you need to reschedule, you may call the CT department at [phone] between the hours of 8:00 am and 5:00 pm, Monday-Friday.  _________________________________________________________________

## 2019-12-15 NOTE — Progress Notes (Signed)
12/15/2019 Nancy Boyer [id number] [birth year]   HISTORY OF PRESENT ILLNESS: This is a pleasant 34 year old female who is new to our office.  Referred here specifically to Dr. Ardis Hughs by Dr. Helane Rima for evaluation regarding altered bowel habits and reports of rectal bleeding.  She tells me that she has had longstanding GI complaints since at least her early 65s.  Used to follow with Dr. Amedeo Plenty at [place] and had a colonoscopy in her early 67s.  She says that that was unremarkable.  She tells me that she has a lot of food allergies and sensitivities.  Says she has always had a sensitive stomach.  She is mostly dairy free and goes gluten-free majority of the time as well.  She just had a baby 3 months ago and she says especially since shehad the baby,may be even before that, she has had some erratic bowel habits alternating from constipation to loose stools with urgency.  She has had 1 or 2 episodes of small amounts of bright red blood on the toilet paper upon wiping after a hard bowel movement.  She states that she just wants to make sure there is nothing more serious going on.  She says that she knows two people in her 61s who have had colon cancer.   Past Medical History:  Diagnosis Date  . Abdominal pain   . Allergic conjunctivitis and rhinitis   . Allergy   . Anal or rectal pain   . Blood in stool   . Lactose intolerance   . Medical history non-contributory   . Migraine   . Nausea & vomiting   . NSVD (normal spontaneous vaginal delivery) 09/14/2013  . Pre-eclampsia   . Pregnancy induced hypertension   . Urticaria   . UTI (urinary tract infection)    Past Surgical History:  Procedure Laterality Date  . BREAST BIOPSY Right   . CYST REMOVAL HAND Left 2002   wrist  . TONSILLECTOMY  2010  . WISDOM TOOTH EXTRACTION  2003    reports that she has never smoked. She has never used smokeless tobacco. She reports previous alcohol use. She reports that she does not use  drugs. family history includes Allergic rhinitis in her mother and sister; Alzheimer's disease in her paternal grandfather; Asthma in her sister; Diabetes in her maternal grandmother, paternal grandfather, and paternal grandmother; Eczema in her sister; Hypertension in her father and mother; Prostate cancer in her paternal grandfather; Stroke in her maternal grandmother. Allergies  Allergen Reactions  . Penicillins Swelling and Rash    Has patient had a PCN reaction causing immediate rash, facial/tongue/throat swelling, SOB or lightheadedness with hypotension: Yes Has patient had a PCN reaction causing severe rash involving mucus membranes or skin necrosis: Yes Has patient had a PCN reaction that required hospitalization No Has patient had a PCN reaction occurring within the last 10 years: Yes If all of the above answers are "NO", then may proceed with Cephalosporin use.   . Vancomycin Itching and Swelling  . Codeine Nausea And Vomiting    Unknown childhood rxn      Outpatient Encounter Medications as of 12/15/2019  Medication Sig  . valACYclovir (VALTREX) 500 MG tablet   . [DISCONTINUED] ibuprofen (ADVIL) 600 MG tablet Take 1 tablet (600 mg total) by mouth every 6 (six) hours as needed for headache, mild pain, moderate pain or cramping.  . [DISCONTINUED] Prenatal Vit-Fe Fumarate-FA (PRENATAL MULTIVITAMIN) TABS tablet Take 1 tablet by mouth daily at 12 noon.  . [  DISCONTINUED] triamcinolone cream (KENALOG) 0.1 % Apply 1 application topically 2 (two) times daily.   No facility-administered encounter medications on file as of 12/15/2019.     REVIEW OF SYSTEMS  : All other systems reviewed and negative except where noted in the History of Present Illness.   PHYSICAL EXAM: Temp 98.3 F (36.8 C)   Ht 5\' 4"  (1.626 m)   Wt 135 lb (61.2 kg)   BMI 23.17 kg/m  General: Well developed white female in no acute distress Head: Normocephalic and atraumatic Eyes:  Sclerae anicteric, conjunctiva  pink. Ears: Normal auditory acuity Lungs: Clear throughout to auscultation; no increased WOB. Heart: Regular rate and rhythm; no M/R/G. Abdomen: Soft, non-distended.  BS present.  Non-tender. Rectal:  No external abnormalities noted.  DRE did not reveal any masses.  Anoscopy performed and showed minimal internal hemorrhoidal tissue. Musculoskeletal: Symmetrical with no gross deformities  Skin: No lesions on visible extremities Extremities: No edema  Neurological: Alert oriented x 4, grossly nonfocal Psychological:  Alert and cooperative. Normal mood and affect  ASSESSMENT AND PLAN: *34 year old female with longstanding GI complaints.  Certainly sounds like she has a history of IBS.  Previously followed with Dr. 20 and had colonoscopy in her early 54s that she reports was unremarkable.  Recently has had altered bowel habits ranging between constipation and urgency.  Has had bright red blood on the toilet paper on 1 or 2 occasions after an episode of constipation.  She just had a baby 3 months ago.  Her altered bowel habits certainly could be hormonal related.  We will check a TSH, sed rate, and CRP.  I am not inclined to repeat colonoscopy and she does not really want a colonoscopy at this time either, but she would like some type of more noninvasive evaluation.  We agreed to CT scan of the abdomen and pelvis with contrast, which would give her some reassurance.  I advised her to begin taking Benefiber 1 to 2 teaspoons daily and increase to twice daily if needed.  I believe that this should be safe while she is nursing, but she was advised to check with her OB.   CC:  38s, * CC:  Dr. Georgina Quint

## 2019-12-16 NOTE — Progress Notes (Signed)
I agree with the above note, plan 

## 2019-12-21 ENCOUNTER — Other Ambulatory Visit: Payer: BC Managed Care – PPO

## 2019-12-28 ENCOUNTER — Inpatient Hospital Stay: Admission: RE | Admit: 2019-12-28 | Payer: BC Managed Care – PPO | Source: Ambulatory Visit

## 2020-01-06 ENCOUNTER — Ambulatory Visit (INDEPENDENT_AMBULATORY_CARE_PROVIDER_SITE_OTHER)
Admission: RE | Admit: 2020-01-06 | Discharge: 2020-01-06 | Disposition: A | Discharge status: Home / Self Care | Payer: BC Managed Care – PPO | Source: Ambulatory Visit | Attending: Gastroenterology | Admitting: Gastroenterology

## 2020-01-06 ENCOUNTER — Other Ambulatory Visit: Payer: Self-pay

## 2020-01-06 DIAGNOSIS — R1084 Generalized abdominal pain: Secondary | ICD-10-CM

## 2020-01-06 DIAGNOSIS — R1032 Left lower quadrant pain: Secondary | ICD-10-CM | POA: Diagnosis not present

## 2020-01-06 DIAGNOSIS — R194 Change in bowel habit: Secondary | ICD-10-CM

## 2020-01-06 DIAGNOSIS — K625 Hemorrhage of anus and rectum: Secondary | ICD-10-CM

## 2020-01-06 MED ORDER — IOHEXOL 300 MG/ML  SOLN
80.0000 mL | Freq: Once | INTRAMUSCULAR | Status: AC | PRN
  Administered 2020-01-06: 80 mL via INTRAVENOUS

## 2020-02-06 ENCOUNTER — Other Ambulatory Visit: Payer: Self-pay | Admitting: Obstetrics and Gynecology

## 2020-02-06 DIAGNOSIS — N912 Amenorrhea, unspecified: Secondary | ICD-10-CM | POA: Diagnosis not present

## 2020-02-06 DIAGNOSIS — N760 Acute vaginitis: Secondary | ICD-10-CM | POA: Diagnosis not present

## 2020-02-06 DIAGNOSIS — N6310 Unspecified lump in the right breast, unspecified quadrant: Secondary | ICD-10-CM | POA: Diagnosis not present

## 2020-02-06 DIAGNOSIS — N63 Unspecified lump in unspecified breast: Secondary | ICD-10-CM

## 2020-02-06 DIAGNOSIS — N8500 Endometrial hyperplasia, unspecified: Secondary | ICD-10-CM | POA: Diagnosis not present

## 2020-02-10 ENCOUNTER — Other Ambulatory Visit: Payer: Self-pay

## 2020-02-10 ENCOUNTER — Ambulatory Visit
Admission: RE | Admit: 2020-02-10 | Discharge: 2020-02-10 | Disposition: A | Discharge status: Home / Self Care | Payer: BC Managed Care – PPO | Source: Ambulatory Visit | Attending: Obstetrics and Gynecology | Admitting: Obstetrics and Gynecology

## 2020-02-10 DIAGNOSIS — N6311 Unspecified lump in the right breast, upper outer quadrant: Secondary | ICD-10-CM | POA: Diagnosis not present

## 2020-02-10 DIAGNOSIS — N63 Unspecified lump in unspecified breast: Secondary | ICD-10-CM

## 2020-02-10 DIAGNOSIS — R922 Inconclusive mammogram: Secondary | ICD-10-CM | POA: Diagnosis not present

## 2020-02-21 ENCOUNTER — Other Ambulatory Visit: Payer: BC Managed Care – PPO

## 2020-03-06 IMAGING — US OBSTETRIC <14 WK US AND TRANSVAGINAL OB US
1 series · 15 of 28 positions shown · non-contrast
Comparison: None.

CLINICAL DATA: Right ectopic pregnancy seen on outside study.
Patient has received methotrexate with an interval rise in beta HCG.

EXAM:
OBSTETRIC <14 WK US AND TRANSVAGINAL OB US
TECHNIQUE: Both transabdominal and transvaginal ultrasound examinations were
performed for complete evaluation of the gestation as well as the
maternal uterus, adnexal regions, and pelvic cul-de-sac.
Transvaginal technique was performed to assess early pregnancy.

[Series 1: obstetric <14 wk us and transvaginal ob us · 15 of 60 slices shown]
[im 1/60]
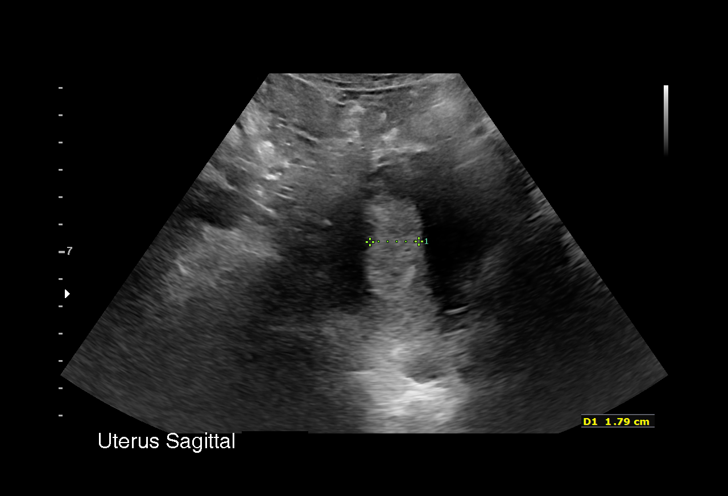
[im 5/60]
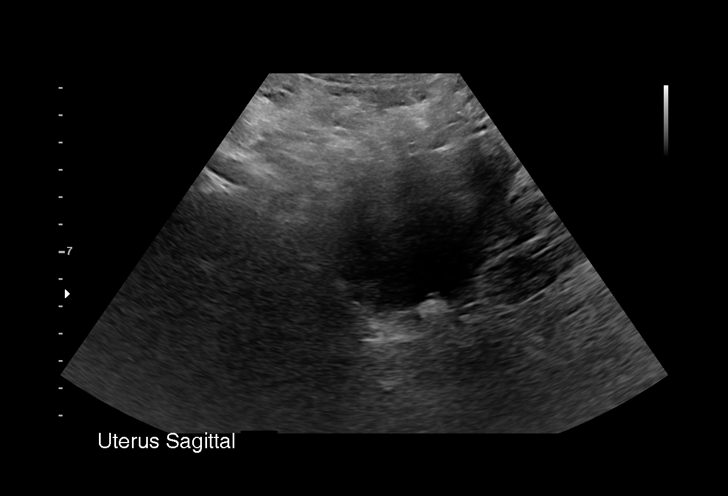
[im 9/60]
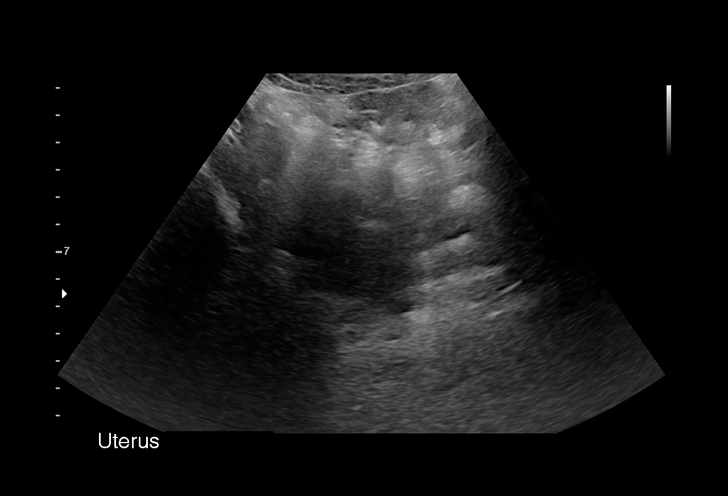
[im 14/60]
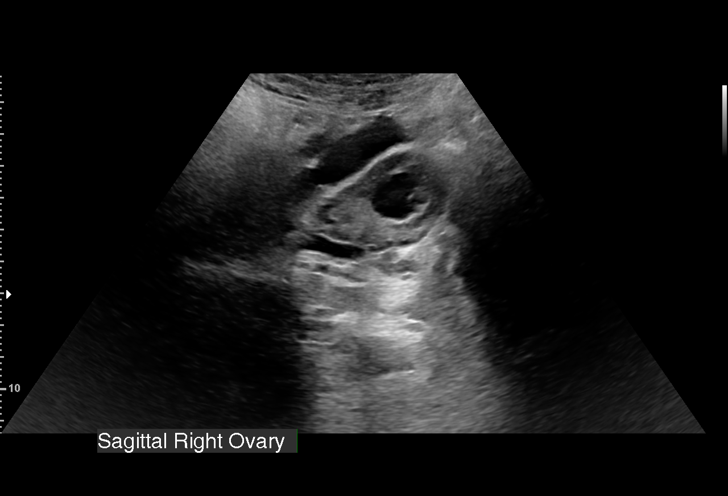
[im 18/60]
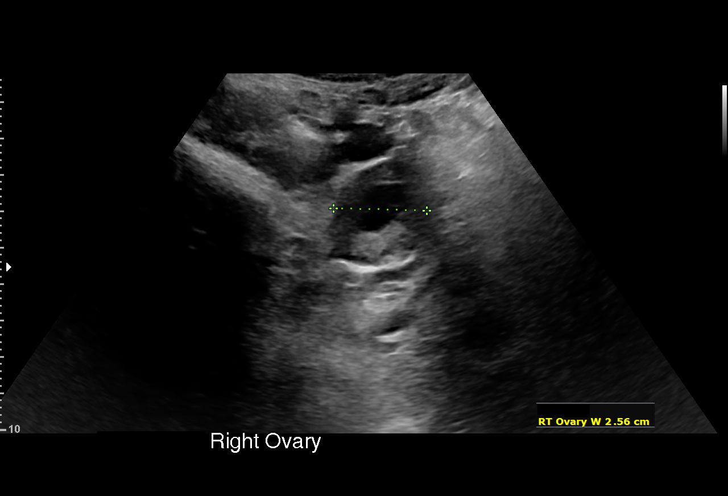
[im 22/60]
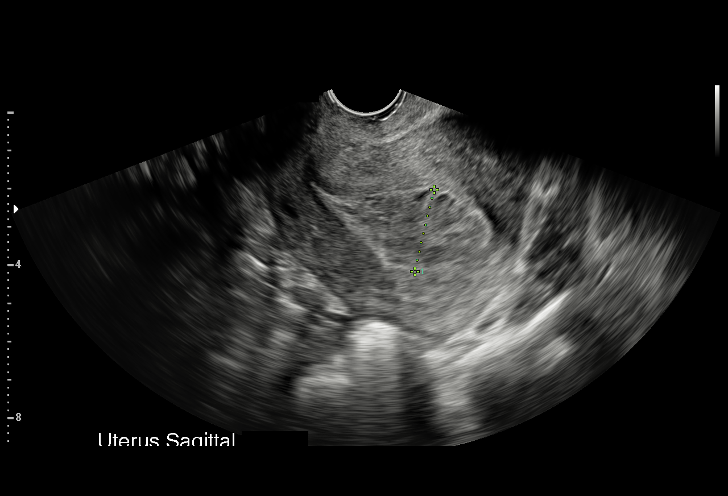
[im 27/60]
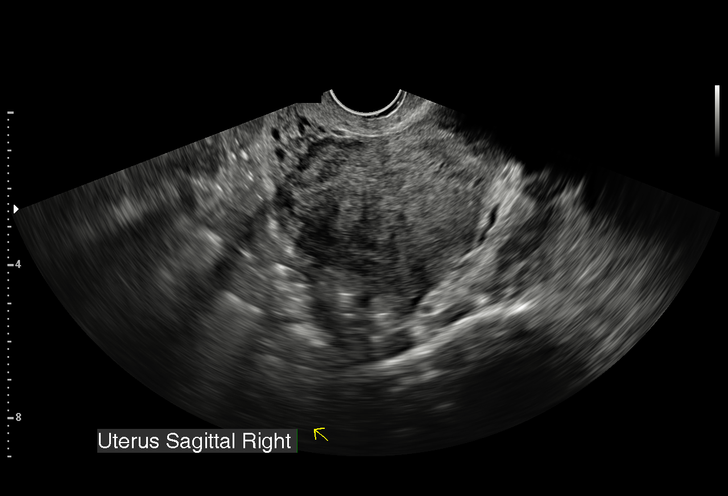
[im 31/60]
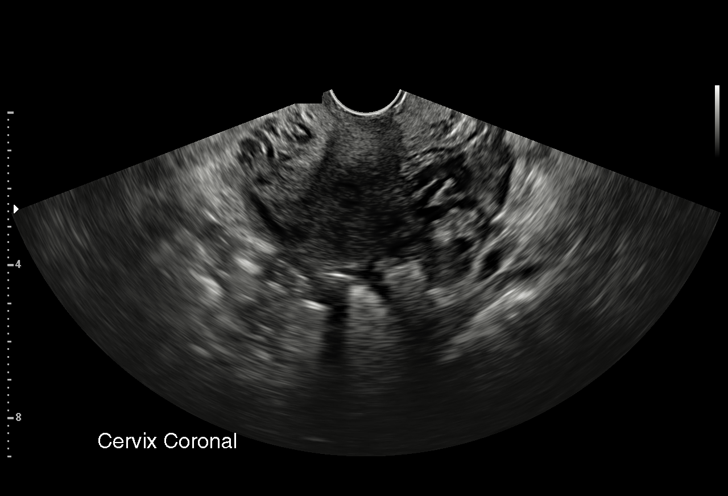
[im 33/60]
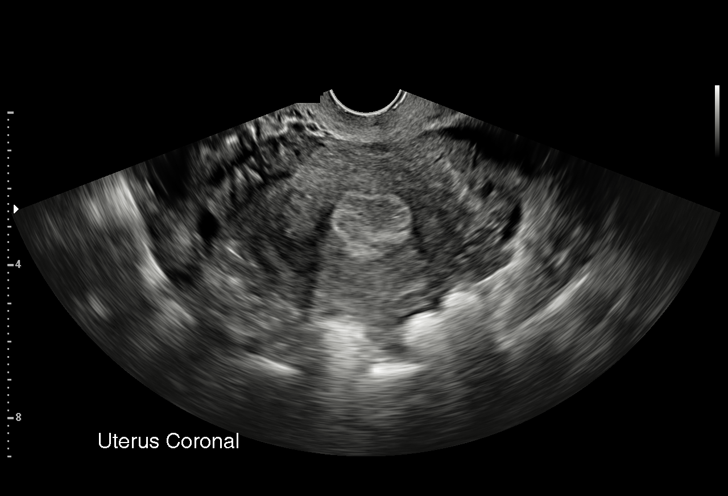
[im 38/60]
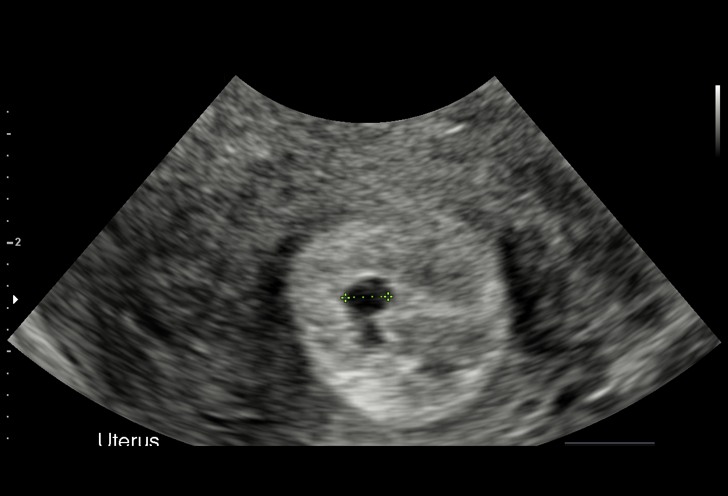
[im 42/60]
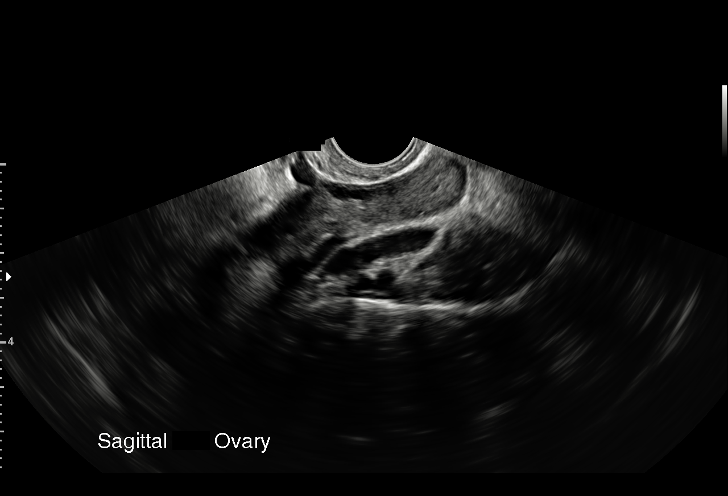
[im 46/60]
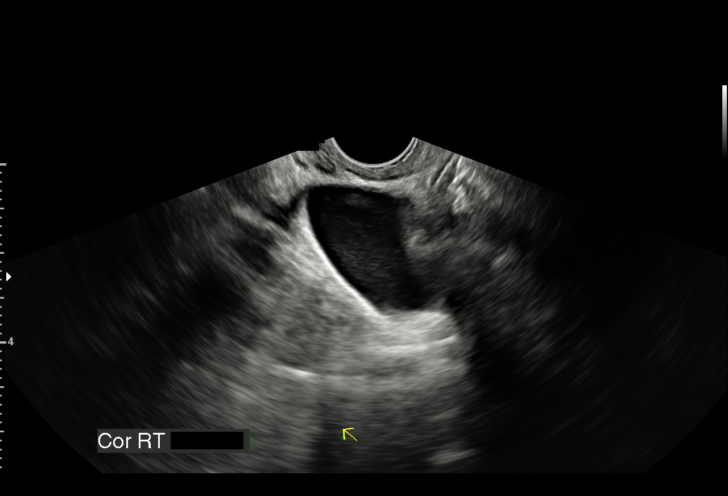
[im 51/60]
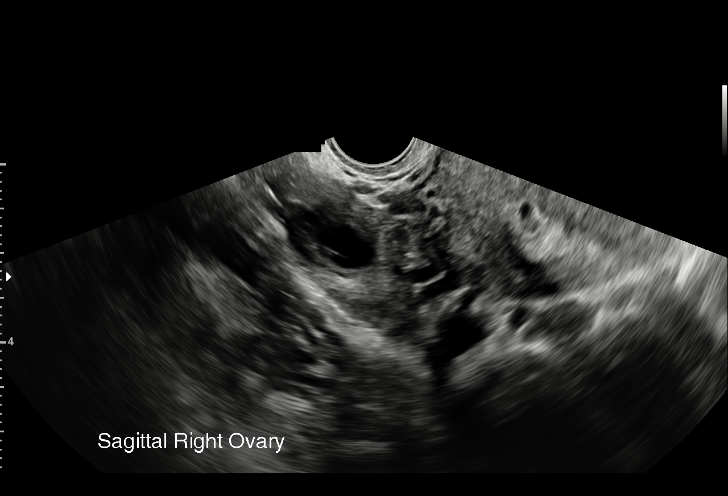
[im 55/60]
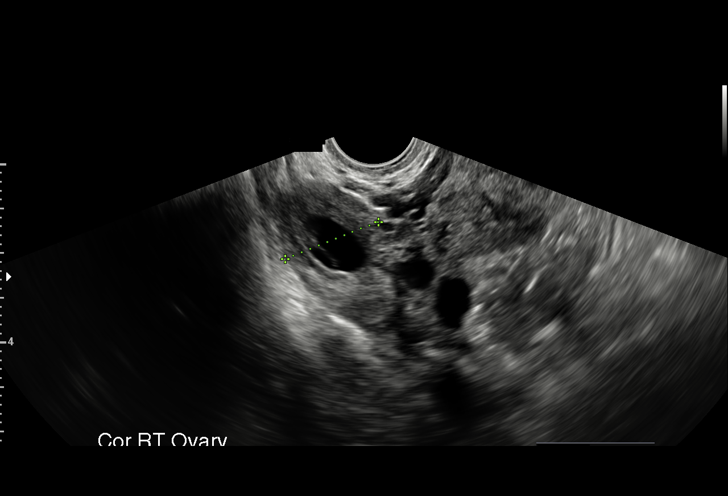
[im 60/60]
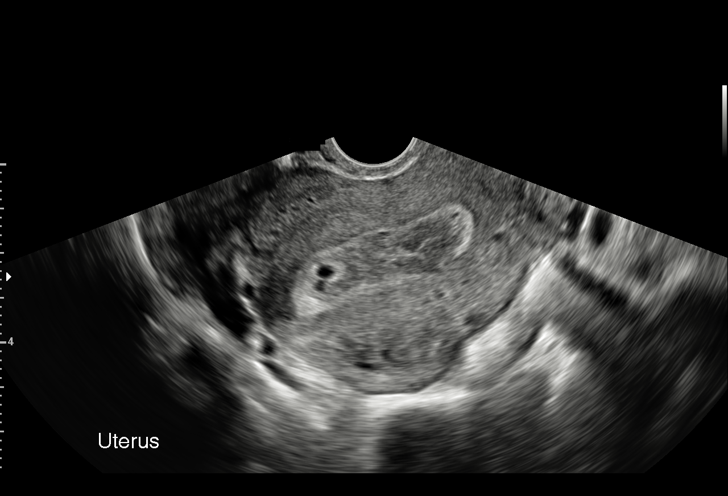

[15 of 28 positions shown; findings below may reference images not displayed]

FINDINGS: Intrauterine gestational sac: The endometrium is thickened to
cm. It is heterogeneous as well. There is a small cystic component
within the endometrium with a mean diameter of 3.2 mm.

Yolk sac:  Not Visualized.

Embryo:  Not Visualized.

Subchorionic hemorrhage:  None visualized.

Maternal uterus/adnexae: Corpus luteum cyst on the right. The
ovaries are otherwise normal. No extra uterine pregnancy identified.
IMPRESSION: 1. An ectopic pregnancy is not visualized on this study. Recommend
correlation with the outside study which by report showed an ectopic
pregnancy on the right.
2. The endometrium is heterogeneous with a small cystic region.
Given the history of an ectopic pregnancy, I doubt this is an
intrauterine gestational sac. Again, recommend comparison to outside
images and clinical correlation.
3. Corpus luteum cyst in the right ovary.
4. No other significant abnormalities.

## 2020-05-22 DIAGNOSIS — M25561 Pain in right knee: Secondary | ICD-10-CM | POA: Diagnosis not present

## 2020-05-22 DIAGNOSIS — M238X1 Other internal derangements of right knee: Secondary | ICD-10-CM | POA: Diagnosis not present

## 2020-05-23 DIAGNOSIS — M25561 Pain in right knee: Secondary | ICD-10-CM | POA: Diagnosis not present

## 2020-05-25 DIAGNOSIS — S82101A Unspecified fracture of upper end of right tibia, initial encounter for closed fracture: Secondary | ICD-10-CM | POA: Diagnosis not present

## 2020-06-14 ENCOUNTER — Telehealth: Payer: Self-pay | Admitting: Gastroenterology

## 2020-06-14 DIAGNOSIS — R194 Change in bowel habit: Secondary | ICD-10-CM

## 2020-06-14 DIAGNOSIS — R1084 Generalized abdominal pain: Secondary | ICD-10-CM

## 2020-06-14 DIAGNOSIS — R195 Other fecal abnormalities: Secondary | ICD-10-CM

## 2020-06-14 NOTE — Telephone Encounter (Signed)
Pls call her, she would like to know if she could have a comprehensive stool study to see if there is a bacteria that is causing her sxs.

## 2020-06-14 NOTE — Telephone Encounter (Signed)
Patient reports that she is continuing to have abdominal pain and loose stool mainly on her left side.  Stools are loose all the time.  "I can feel a deep throbbing pain in my colon and rectum".  .  She refuses appt with Christella Hartigan or Doug Sou, PA Wants a comprehensive stool study run for infections bacterial, viral and tested for H pylori. She does not feel she needs to come into the office "this is not my first rodeo".  "I don't want to pay for an office visit when they can collaborate with me and order what I need".

## 2020-06-14 NOTE — Telephone Encounter (Addendum)
Patient notified New lab orders noted She is scheduled for follow up with Dr. Christella Hartigan

## 2020-06-14 NOTE — Telephone Encounter (Signed)
I have never met her before.  She was last in our office 6 months ago.  Please order gi pathogen panel and h. Pylori stool antigen and schedule for app with me, next available.    htanks

## 2020-06-15 DIAGNOSIS — S82101A Unspecified fracture of upper end of right tibia, initial encounter for closed fracture: Secondary | ICD-10-CM | POA: Diagnosis not present

## 2020-06-20 DIAGNOSIS — R29898 Other symptoms and signs involving the musculoskeletal system: Secondary | ICD-10-CM | POA: Diagnosis not present

## 2020-06-20 DIAGNOSIS — N760 Acute vaginitis: Secondary | ICD-10-CM | POA: Diagnosis not present

## 2020-06-20 DIAGNOSIS — Z3202 Encounter for pregnancy test, result negative: Secondary | ICD-10-CM | POA: Diagnosis not present

## 2020-06-20 DIAGNOSIS — R3982 Chronic bladder pain: Secondary | ICD-10-CM | POA: Diagnosis not present

## 2020-06-20 DIAGNOSIS — N939 Abnormal uterine and vaginal bleeding, unspecified: Secondary | ICD-10-CM | POA: Diagnosis not present

## 2020-07-03 ENCOUNTER — Other Ambulatory Visit: Payer: BC Managed Care – PPO

## 2020-07-03 DIAGNOSIS — R1084 Generalized abdominal pain: Secondary | ICD-10-CM | POA: Diagnosis not present

## 2020-07-03 DIAGNOSIS — R195 Other fecal abnormalities: Secondary | ICD-10-CM | POA: Diagnosis not present

## 2020-07-03 DIAGNOSIS — R194 Change in bowel habit: Secondary | ICD-10-CM

## 2020-07-03 DIAGNOSIS — M25561 Pain in right knee: Secondary | ICD-10-CM | POA: Diagnosis not present

## 2020-07-04 LAB — HELICOBACTER PYLORI  SPECIAL ANTIGEN
MICRO NUMBER:: 11149004
SPECIMEN QUALITY: ADEQUATE

## 2020-07-06 DIAGNOSIS — S82101D Unspecified fracture of upper end of right tibia, subsequent encounter for closed fracture with routine healing: Secondary | ICD-10-CM | POA: Diagnosis not present

## 2020-07-07 LAB — GI PROFILE, STOOL, PCR

## 2020-07-09 ENCOUNTER — Other Ambulatory Visit: Payer: Self-pay

## 2020-07-09 MED ORDER — DIFICID 200 MG PO TABS: 200.0000 mg | ORAL_TABLET | Freq: Two times a day (BID) | ORAL | 0 refills | Status: DC

## 2020-07-09 MED ORDER — DIFICID 200 MG PO TABS: 200.0000 mg | ORAL_TABLET | Freq: Two times a day (BID) | ORAL | 0 refills | Status: AC

## 2020-07-10 ENCOUNTER — Telehealth: Payer: Self-pay | Admitting: Gastroenterology

## 2020-07-10 NOTE — Telephone Encounter (Signed)
Left message on machine to call back  

## 2020-07-10 NOTE — Telephone Encounter (Signed)
Pt is requesting a call back from a nurse to discuss the medication DIFICID, pt is trying to obtain more information on this medication.

## 2020-07-11 ENCOUNTER — Telehealth: Payer: Self-pay | Admitting: Gastroenterology

## 2020-07-11 DIAGNOSIS — M25561 Pain in right knee: Secondary | ICD-10-CM | POA: Diagnosis not present

## 2020-07-11 DIAGNOSIS — M25661 Stiffness of right knee, not elsewhere classified: Secondary | ICD-10-CM | POA: Diagnosis not present

## 2020-07-11 DIAGNOSIS — A0472 Enterocolitis due to Clostridium difficile, not specified as recurrent: Secondary | ICD-10-CM | POA: Insufficient documentation

## 2020-07-11 NOTE — Telephone Encounter (Signed)
Joni Reining have you seen a prior auth for dificid for this pt?

## 2020-07-11 NOTE — Telephone Encounter (Signed)
It came thru the fax and was given to Hilma Favors, Charity fundraiser.

## 2020-07-11 NOTE — Telephone Encounter (Signed)
Left message on machine to call back  Advised pt on voice mail to call back if she still has concerns

## 2020-07-13 NOTE — Telephone Encounter (Signed)
I have advised the pt that the prior auth was sent to her insurance on 11/10 and they have 72 hours to get [place] a response.  The pt thanked me for calling.

## 2020-07-13 NOTE — Telephone Encounter (Signed)
Patient calling to follow up on previous message states she has been waiting for about 5 days now and is needing for it to be resolved also stated that if she does not hear from anyone that she will be stopping by the office to speak with Dr. Christella Hartigan or someone in person please advise

## 2020-07-16 DIAGNOSIS — M25561 Pain in right knee: Secondary | ICD-10-CM | POA: Diagnosis not present

## 2020-07-16 DIAGNOSIS — M25661 Stiffness of right knee, not elsewhere classified: Secondary | ICD-10-CM | POA: Diagnosis not present

## 2020-07-17 NOTE — Telephone Encounter (Signed)
Pt is returning a missed call from yesterday, pt would like a nurse to call her back

## 2020-07-17 NOTE — Telephone Encounter (Signed)
The pt wanted to notify this office that she was able to pick up dificid with a [employer] coupon.  She will call if she has any further problems and she will keep appt as planned

## 2020-07-18 DIAGNOSIS — M25561 Pain in right knee: Secondary | ICD-10-CM | POA: Diagnosis not present

## 2020-07-18 DIAGNOSIS — M25661 Stiffness of right knee, not elsewhere classified: Secondary | ICD-10-CM | POA: Diagnosis not present

## 2020-08-08 ENCOUNTER — Encounter: Payer: Self-pay | Admitting: Gastroenterology

## 2020-08-08 ENCOUNTER — Ambulatory Visit (INDEPENDENT_AMBULATORY_CARE_PROVIDER_SITE_OTHER): Payer: BC Managed Care – PPO | Admitting: Gastroenterology

## 2020-08-08 VITALS — BP 120/76 | HR 95 | Ht 64.0 in | Wt 128.0 lb

## 2020-08-08 DIAGNOSIS — A0472 Enterocolitis due to Clostridium difficile, not specified as recurrent: Secondary | ICD-10-CM | POA: Diagnosis not present

## 2020-08-08 MED ORDER — DIFICID 200 MG PO TABS: 200.0000 mg | ORAL_TABLET | Freq: Two times a day (BID) | ORAL | 0 refills | Status: DC

## 2020-08-08 NOTE — Patient Instructions (Signed)
If you are age 34 or older, your body mass index should be between 23-30. Your Body mass index is 21.97 kg/m. If this is out of the aforementioned range listed, please consider follow up with your Primary Care Provider.  If you are age 58 or younger, your body mass index should be between 19-25. Your Body mass index is 21.97 kg/m. If this is out of the aformentioned range listed, please consider follow up with your Primary Care Provider.   We have sent the following medications to your pharmacy for you to pick up at your convenience:  START: Dificid 200mg  take one tablet twice daily for 10 days.  Please call our office at [phone] in 4 weeks to let [phone] know how you are feeling.  Thank you for entrusting me with your care and choosing [facility].  Dr [facility]

## 2020-08-08 NOTE — Progress Notes (Signed)
Review of pertinent gastrointestinal problems: 1.  C. difficile associated diarrhea, bowel changes.  November 2021 toxin positive.  Dificid 200 mg 1 pill twice daily for 10 days (she has allergy to vancomycin) 2.  She was told she had irritable bowel syndrome in her 88s, long-term relationship with [facility], remote colonoscopy and multiple other interval GI tests.   HPI: This is a pleasant 34 year old woman.  This is the first time I am meeting her.  She was here in our office in April 2021 she saw Shanda Bumps.  She was having some changes in her bowel habits.  Blood work including CBC, complete metabolic profile, inflammatory markers and thyroid testing were all normal. CT scan with abdomen pelvis with IV and oral contrast was normal except for "moderate stool burden"  She called around October 2021 with a "deep throbbing pain in her colon and rectum" loose stools constantly.  She declined any further appointments.   I recommended GI pathogen panel and H. pylori stool testing which she did complete.  Results as above.  Abdominal ultrasound November 2020 for left upper quadrant pain showed normal gallbladder.  Upper GI with small bowel follow-through ordered by Dr. Dorena Cookey March 2016 for "recurrent nausea, vague abdominal pain and discomfort" was essentially normal  She completed 10 days of Dificid twice daily and felt great after the first for 5 days and then her symptoms rebounded.  She thinks she has actually worse after completing the antibiotics now.  Certainly her chronic loose stools have resolved but she is more bothered by left-sided abdominal pains, bloating.  She is having some anal discomfort as well.  She does not see blood in her stool.  Her weight is overall stable.  ROS: complete GI ROS as described in HPI, all other review negative.  Constitutional:  No unintentional weight loss   Past Medical History:  Diagnosis Date  . Abdominal pain   . Allergic conjunctivitis and  rhinitis   . Allergy   . Anal or rectal pain   . Blood in stool   . Lactose intolerance   . Medical history non-contributory   . Migraine   . Nausea & vomiting   . NSVD (normal spontaneous vaginal delivery) 09/14/2013  . Pre-eclampsia   . Pregnancy induced hypertension   . Urticaria   . UTI (urinary tract infection)     Past Surgical History:  Procedure Laterality Date  . BREAST BIOPSY Right   . CYST REMOVAL HAND Left 2002   wrist  . TONSILLECTOMY  2010  . WISDOM TOOTH EXTRACTION  2003    Current Outpatient Medications  Medication Sig Dispense Refill  . valACYclovir (VALTREX) 500 MG tablet      No current facility-administered medications for this visit.    Allergies as of 08/08/2020 - Review Complete 08/08/2020  Allergen Reaction Noted  . Penicillins Swelling and Rash 09/11/2013  . Vancomycin Itching and Swelling 08/27/2019  . Codeine Nausea And Vomiting 09/11/2013    Family History  Problem Relation Age of Onset  . Prostate cancer Paternal Grandfather   . Diabetes Paternal Grandfather   . Alzheimer's disease Paternal Grandfather   . Hypertension Mother   . Allergic rhinitis Mother   . Hypertension Father   . Diabetes Paternal Grandmother   . Diabetes Maternal Grandmother   . Stroke Maternal Grandmother   . Allergic rhinitis Sister   . Eczema Sister   . Asthma Sister   . Migraines Neg Hx   . Angioedema Neg Hx   .  Immunodeficiency Neg Hx   . Urticaria Neg Hx     Social History   Socioeconomic History  . Marital status: Married    Spouse name: Not on file  . Number of children: Not on file  . Years of education: Not on file  . Highest education level: Not on file  Occupational History  . Occupation: Event organiser: [employer]    Comment: [employer]  Tobacco Use  . Smoking status: Never Smoker  . Smokeless tobacco: Never Used  Vaping Use  . Vaping Use: Never used  Substance and Sexual Activity  . Alcohol use: Not Currently    Comment:  occ  . Drug use: No  . Sexual activity: Yes    Birth control/protection: None  Other Topics Concern  . Not on file  Social History Narrative   Patient is married with 1 child,and works full time at [employer].    Patient is right handed.   Patient has a Bachelor's degree.   Patient drinks 1 cup every two weeks.   Social Determinants of Health   Financial Resource Strain:   . Difficulty of Paying Living Expenses: Not on file  Food Insecurity:   . Worried About Programme researcher, broadcasting/film/video in the Last Year: Not on file  . Ran Out of Food in the Last Year: Not on file  Transportation Needs:   . Lack of Transportation (Medical): Not on file  . Lack of Transportation (Non-Medical): Not on file  Physical Activity:   . Days of Exercise per Week: Not on file  . Minutes of Exercise per Session: Not on file  Stress:   . Feeling of Stress : Not on file  Social Connections:   . Frequency of Communication with Friends and Family: Not on file  . Frequency of Social Gatherings with Friends and Family: Not on file  . Attends Religious Services: Not on file  . Active Member of Clubs or Organizations: Not on file  . Attends Banker Meetings: Not on file  . Marital Status: Not on file  Intimate Partner Violence:   . Fear of Current or Ex-Partner: Not on file  . Emotionally Abused: Not on file  . Physically Abused: Not on file  . Sexually Abused: Not on file     Physical Exam: BP 120/76   Pulse 95   Ht 5\' 4"  (1.626 m)   Wt 128 lb (58.1 kg)   LMP 07/20/2020 (Approximate)   Breastfeeding No   BMI 21.97 kg/m  Constitutional: generally well-appearing Psychiatric: alert and oriented x3 Abdomen: soft, nontender, nondistended, no obvious ascites, no peritoneal signs, normal bowel sounds No peripheral edema noted in lower extremities Rectal examination with female assistant in the room: Possibly very small shallow healed posterior midline anal fissure, this was nontender and so I am not sure  if there really was healed fissure there.  The skin just looks slightly more pale there.  Otherwise completely normal digital rectal exam.  Assessment and plan: 34 y.o. female with recent C. difficile, left-sided abdominal pains  First I explained that it is not uncommon to C. difficile is not completely eradicated after first course of antibiotics and so I recommended that we repeat her Dificid 200 mg pills 1 pill twice daily for 10 days and she will call back in 3 to 4 weeks to report on her response.  If Dificid is not possible due to what ever financial constraints we encounter then she will need her  2-week course of Flagyl 3 times daily.  If this repeat antibiotic course is not helpful and she is still bothered by abdominal pains, altered bowel habits then I would likely recommend a colonoscopy.  She did have one before but it was 15 to 20 years ago.  Please see the "Patient Instructions" section for addition details about the plan.  Rob Bunting, MD [facility] 08/08/2020, 10:06 AM   Total time on date of encounter was 30 minutes (this included time spent preparing to see the patient reviewing records; obtaining and/or reviewing separately obtained history; performing a medically appropriate exam and/or evaluation; counseling and educating the patient and family if present; ordering medications, tests or procedures if applicable; and documenting clinical information in the health record).

## 2020-08-10 ENCOUNTER — Telehealth: Payer: Self-pay | Admitting: Gastroenterology

## 2020-08-10 MED ORDER — METRONIDAZOLE 500 MG PO TABS: 500.0000 mg | ORAL_TABLET | Freq: Three times a day (TID) | ORAL | 0 refills | Status: AC

## 2020-08-10 NOTE — Telephone Encounter (Signed)
Patient aware that Dificid was denied by [facility].  Patient advised that labs from 07-03-2020 has been faxed to [facility] to appeal the denial.  Patient requested that Flagyl 500mg  (per Dr ) three times daily for 14 days be sent to pharmacy as instructed by Dr Leone Payor in his last office visit note.  Rx sent to pharmacy.  Patient agreed to plan and verbalized understanding.

## 2020-08-10 NOTE — Telephone Encounter (Signed)
Left message on voicemail that Dificid has been denied.  Patient advised to return call as soon as possible to further discuss.

## 2020-08-10 NOTE — Telephone Encounter (Signed)
Inbound call from patient stating pharmacy is waiting for prior authorization for Dificid medication.

## 2020-08-10 NOTE — Telephone Encounter (Signed)
Patient aware that Dificid was denied by [employer].  Patient advised that labs from 07-03-2020 has been faxed to [employer] to appeal the denial.  Patient requested that Flagyl 500mg (per Dr Gessner) three times daily for 14 days be sent to pharmacy as instructed by Dr Jacobs in his last office visit note.  Rx sent to pharmacy.  Patient agreed to plan and verbalized understanding. 

## 2020-08-15 DIAGNOSIS — D235 Other benign neoplasm of skin of trunk: Secondary | ICD-10-CM | POA: Diagnosis not present

## 2020-08-15 DIAGNOSIS — D2271 Melanocytic nevi of right lower limb, including hip: Secondary | ICD-10-CM | POA: Diagnosis not present

## 2020-08-16 ENCOUNTER — Telehealth: Payer: Self-pay | Admitting: Gastroenterology

## 2020-08-16 DIAGNOSIS — R197 Diarrhea, unspecified: Secondary | ICD-10-CM

## 2020-08-16 DIAGNOSIS — R194 Change in bowel habit: Secondary | ICD-10-CM

## 2020-08-16 DIAGNOSIS — A0472 Enterocolitis due to Clostridium difficile, not specified as recurrent: Secondary | ICD-10-CM

## 2020-08-16 DIAGNOSIS — R1084 Generalized abdominal pain: Secondary | ICD-10-CM

## 2020-08-16 NOTE — Telephone Encounter (Signed)
Received fax from [facility] requesting newer or more recent lab testing to confirm Cdiff for the approval of Dificid.  Phone call made to patient and she has picked up Rx for Flagyl and started taking over the past week.  Will disregard fax for approval of Dificid as she is currently taking Flagyl. Patient agreed to plan and verbalized understanding.

## 2020-08-17 NOTE — Telephone Encounter (Signed)
OK. Let her know that if we are going that route we must prove whether or not she still has c diff because we cannot take the chance of her showing up to [facility] with c. Diff in her colon.   Needs Gi pathogen panel and then based on that we will determine if/when she can have colonoscopy.

## 2020-08-17 NOTE — Telephone Encounter (Signed)
Pt is requesting a call back from a nurse to discuss the medication FLAGYL.

## 2020-08-17 NOTE — Telephone Encounter (Signed)
Return call was made to patient to inform her of Dr Christella Hartigan' recommendations of completing GI pathogen panel prior to having a colonoscopy.  Patient was very upset and frustrated with the plan.  Patient states " Are you kidding me? After all I have been through and this is all you can say to me? I have been very sick having stomach and back spasms and you are telling me I can't have a colonoscopy?  I was going to have a colonoscopy in the beginning, but I choose not to because I have a small child and I thought that would be too invasive.  I  suggested having stool testing that would confirm Cdiff. If I wouldn't have refused the colonoscopy, I would have had Cdiff and you guys would have never known.  So, what if I had hand, foot, and mouth but I didn't tell you before an appointment, you would let me be seen.  If I told you I had it then you wouldn't allow me in the office. I am frustrated with your services and I do not feel like I am being given good care.  Didn't Dr Christella Hartigan take a doctor's oath to take care of his patients? I wouldn't recommend him to anyone, and I will make sure my OBGYN who referred me knows the care I have been given.  I will complete the testing that he as recommended, but I will find another GI physician so that I can have a colonoscopy sooner rather than later."  Order for GI pathogen panel was entered. Will await results.

## 2020-08-17 NOTE — Telephone Encounter (Signed)
Patient states that she can not tolerate Flagyl.  She feels like medication is causing her to be "violently sick with severe stomach pains."  She is not sure that she even has Cdiff as she was not retested last time and she prefers not to complete the medication.  She would like to proceed with colonoscopy.

## 2020-08-17 NOTE — Addendum Note (Signed)
Addended by: Lamona Curl on: 08/17/2020 11:27 AM   Modules accepted: Orders

## 2020-08-17 NOTE — Telephone Encounter (Signed)
Nichole, I am sorry you had to be on the receiving end of all that. She was equally insistent and unpleasant during a phone conversation documented on 06/14/2020.    It looks like she is going to seek out care from another gastroenterologist. Hopefully she will feel more comfortable with a new team.  I have reviewed all of her notes and it is very clear that she has gotten excellent care from [place].

## 2020-08-29 ENCOUNTER — Other Ambulatory Visit: Payer: BC Managed Care – PPO

## 2020-08-29 DIAGNOSIS — R1084 Generalized abdominal pain: Secondary | ICD-10-CM

## 2020-08-29 DIAGNOSIS — R197 Diarrhea, unspecified: Secondary | ICD-10-CM

## 2020-08-29 DIAGNOSIS — A0472 Enterocolitis due to Clostridium difficile, not specified as recurrent: Secondary | ICD-10-CM

## 2020-08-29 DIAGNOSIS — R194 Change in bowel habit: Secondary | ICD-10-CM

## 2020-09-01 NOTE — L&D Delivery Note (Signed)
Delivery Note At 4:12 PM a viable female was delivered via Vaginal, Spontaneous (Presentation:   Occiput Anterior).  APGAR: 8 and 9 weight  pending.   Placenta status: Spontaneous, Intact.  Cord: 3 vessels with the following complications: None.  Cord pH: not obtained  Anesthesia: Epidural Episiotomy: None Lacerations: none  Suture Repair:  none Est. Blood Loss (mL):  300  Mom to postpartum.  Baby to Couplet care / Skin to Skin.  Jeani Hawking 08/08/2021, 4:23 PM

## 2020-09-03 ENCOUNTER — Telehealth: Payer: Self-pay | Admitting: Gastroenterology

## 2020-09-03 NOTE — Telephone Encounter (Signed)
Patient informed that we have not received GI pathogen panel results at this time.  Patient advised that it normally take 3 to 5 business days to get these results back, and it could take longer due to the New Year.  Patient advised she will be notified of results once they are received.  Patient was very understanding and thanked me for the call.

## 2020-09-04 ENCOUNTER — Telehealth: Payer: Self-pay | Admitting: Gastroenterology

## 2020-09-04 LAB — GI PROFILE, STOOL, PCR

## 2020-09-04 NOTE — Telephone Encounter (Signed)
Patient was notified of lab results per Hilma Favors, RN.  Patient will no longer accept care for our practice per lab result note.

## 2020-09-04 NOTE — Telephone Encounter (Signed)
Inbound call from Talma from [facility] stating C difficile toxin A/B was detected on patient's lab.  She will be faxing over the results to you. 

## 2021-01-01 LAB — HEPATITIS C ANTIBODY: HCV Ab: NEGATIVE

## 2021-01-01 LAB — OB RESULTS CONSOLE GC/CHLAMYDIA
Chlamydia: NEGATIVE
Gonorrhea: NEGATIVE

## 2021-01-01 LAB — OB RESULTS CONSOLE ABO/RH: RH Type: POSITIVE

## 2021-01-01 LAB — OB RESULTS CONSOLE ANTIBODY SCREEN: Antibody Screen: NEGATIVE

## 2021-01-01 LAB — OB RESULTS CONSOLE RUBELLA ANTIBODY, IGM: Rubella: IMMUNE

## 2021-01-01 LAB — OB RESULTS CONSOLE HIV ANTIBODY (ROUTINE TESTING): HIV: NONREACTIVE

## 2021-01-01 LAB — OB RESULTS CONSOLE RPR: RPR: NONREACTIVE

## 2021-01-01 LAB — OB RESULTS CONSOLE HEPATITIS B SURFACE ANTIGEN: Hepatitis B Surface Ag: NEGATIVE

## 2021-06-08 IMAGING — MG DIGITAL DIAGNOSTIC BILAT W/ TOMO W/ CAD
6 series · 6 of 18 positions shown · non-contrast
Comparison: 12/24/2016 and earlier

CLINICAL DATA: Palpable abnormality in the RIGHT breast first noted
2 months ago. Patient is currently breastfeeding.

EXAM:
DIGITAL DIAGNOSTIC BILATERAL MAMMOGRAM WITH CAD AND TOMO
ULTRASOUND RIGHT BREAST

[L CC synth-2D]
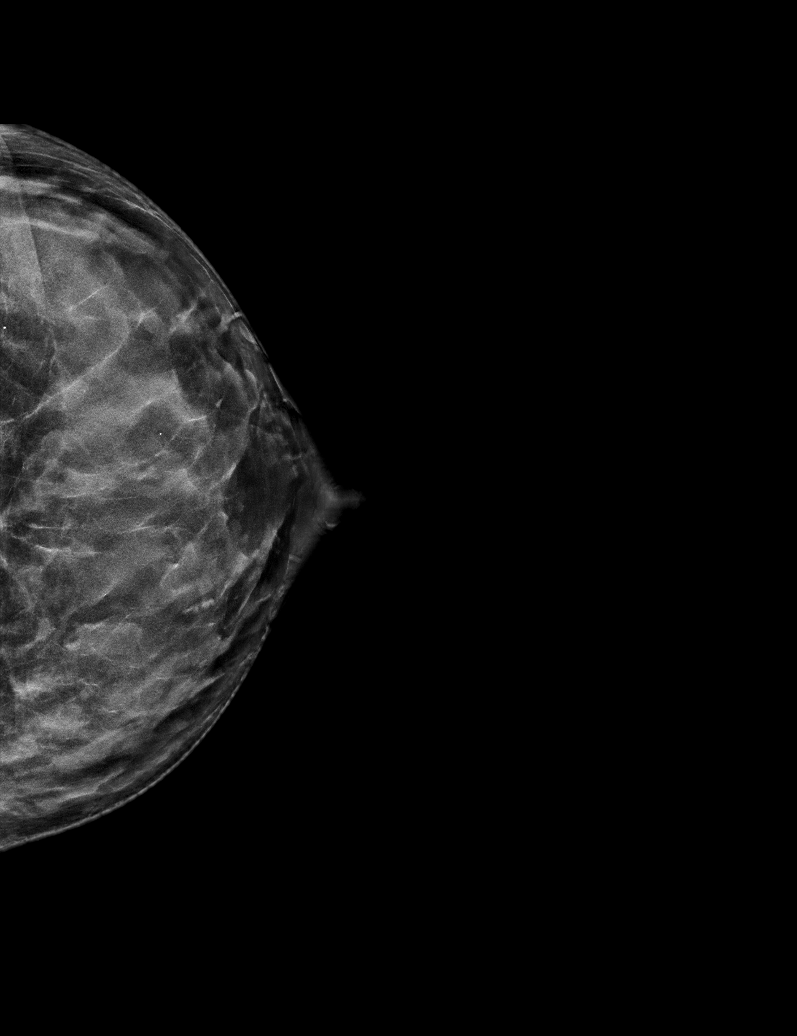

[L MLO synth-2D]
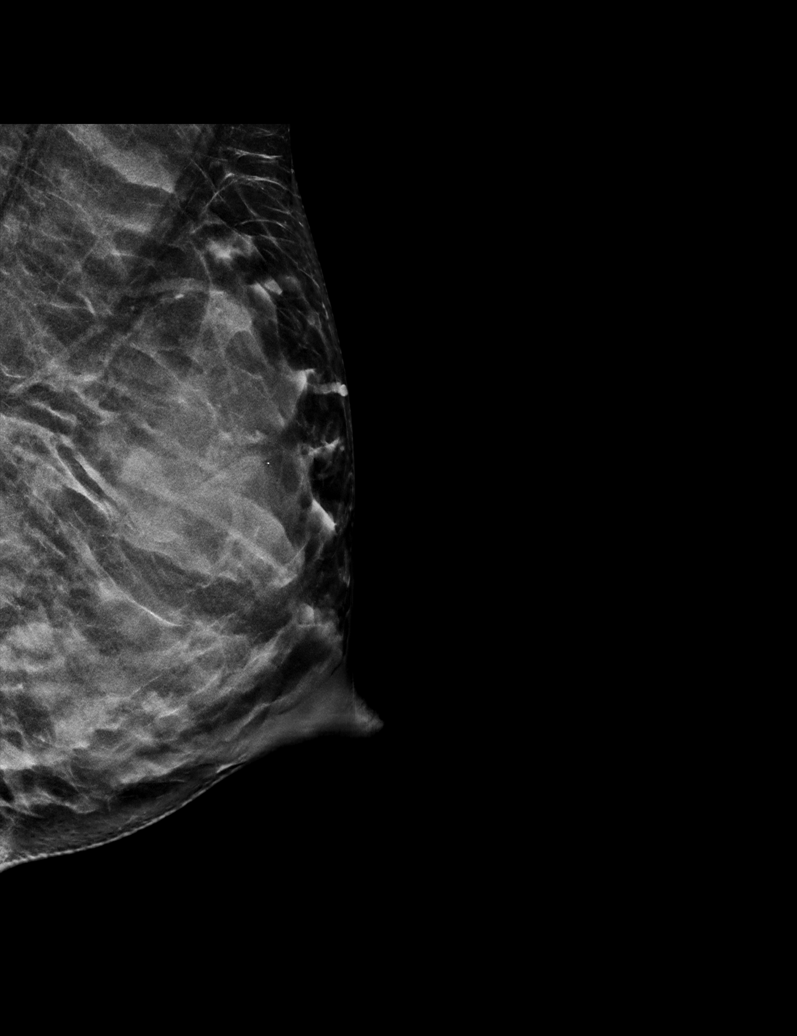

[R MLO synth-2D]
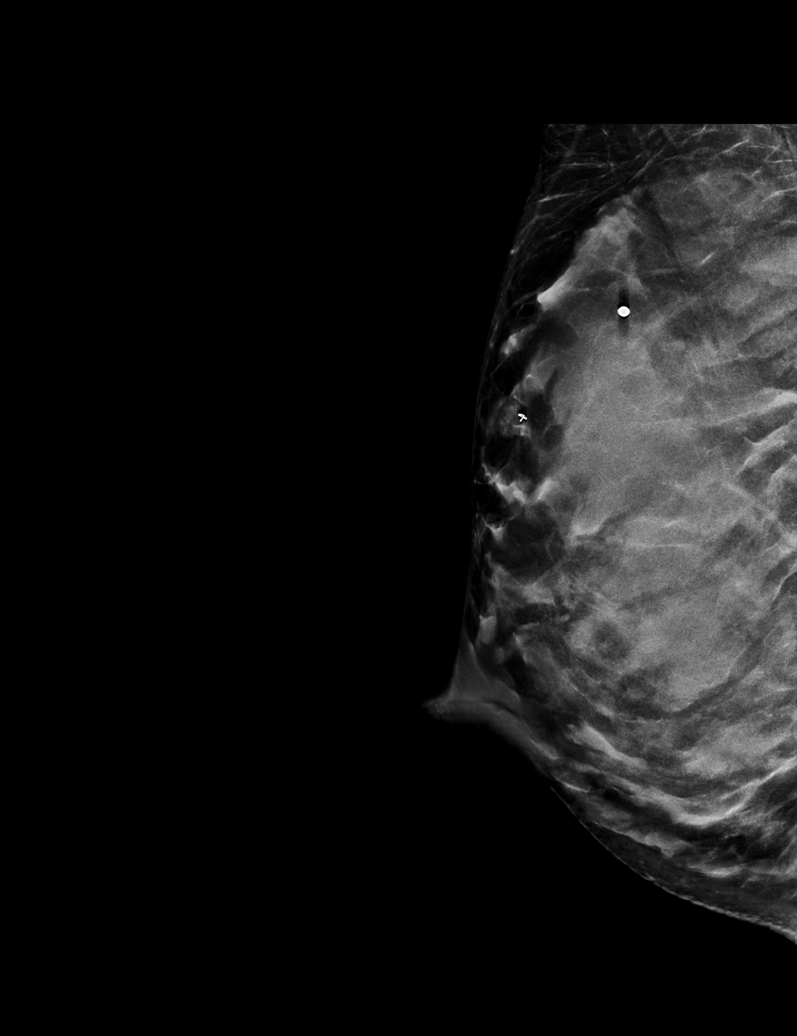

[R CC tomo · tomo slice 35/69.0]
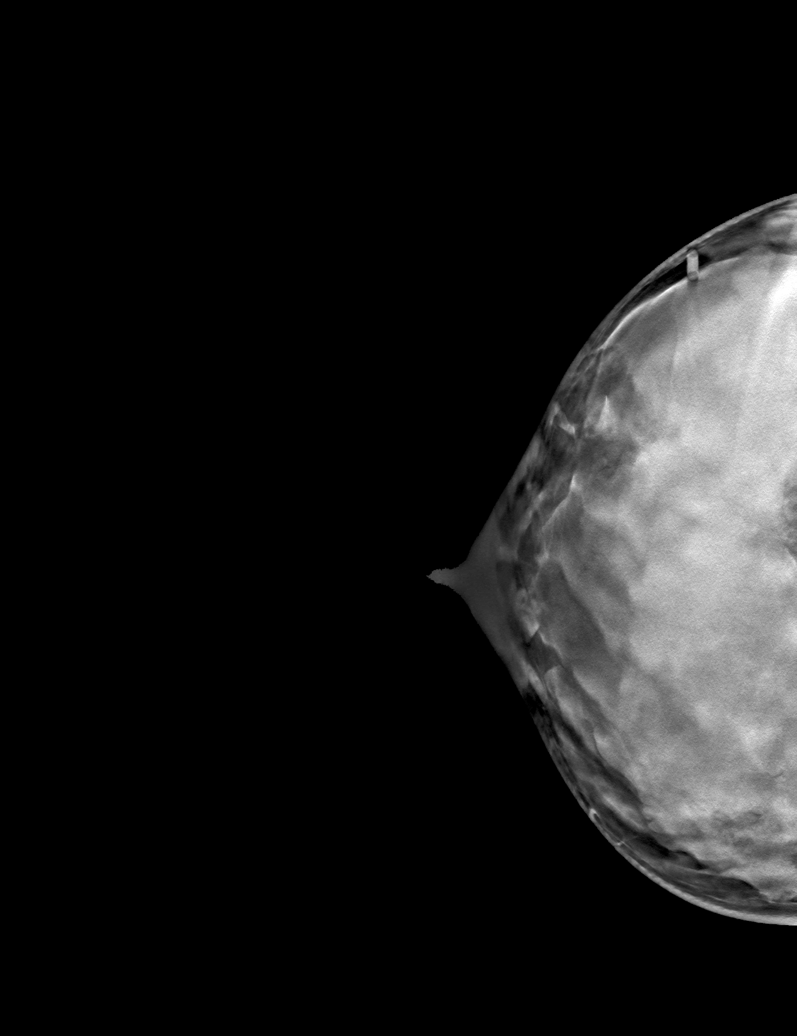

[R TAN tomo · tomo slice 25/49.0]
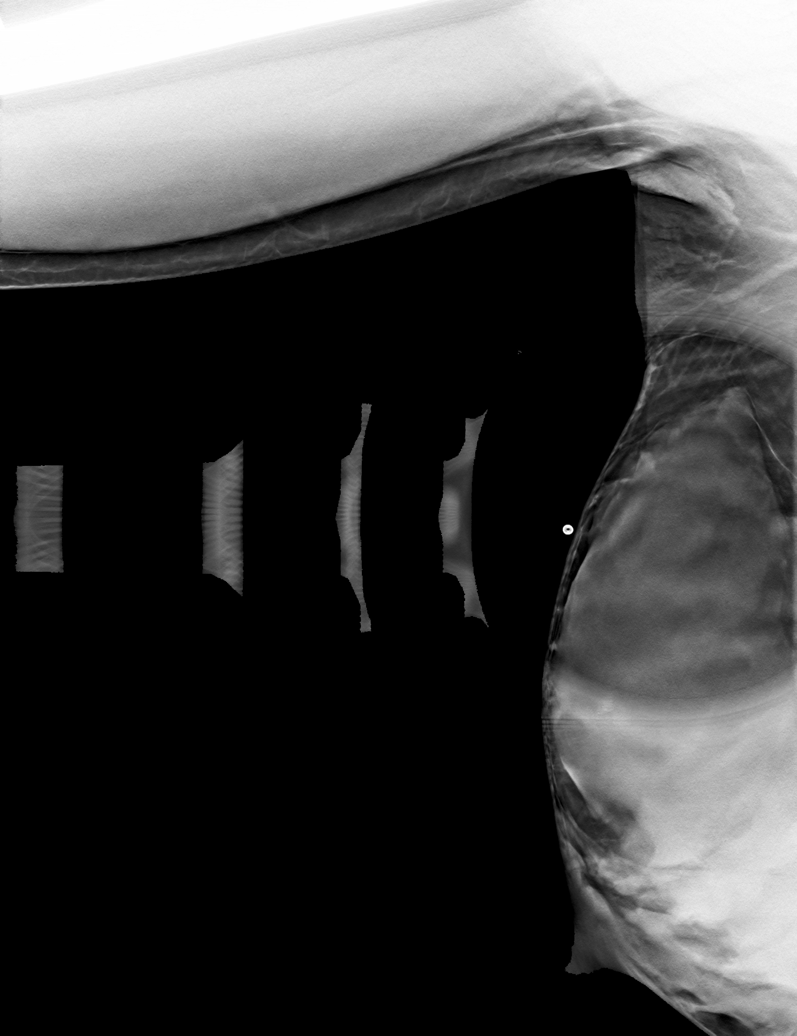

[R MLO tomo · tomo slice 31/62.0]
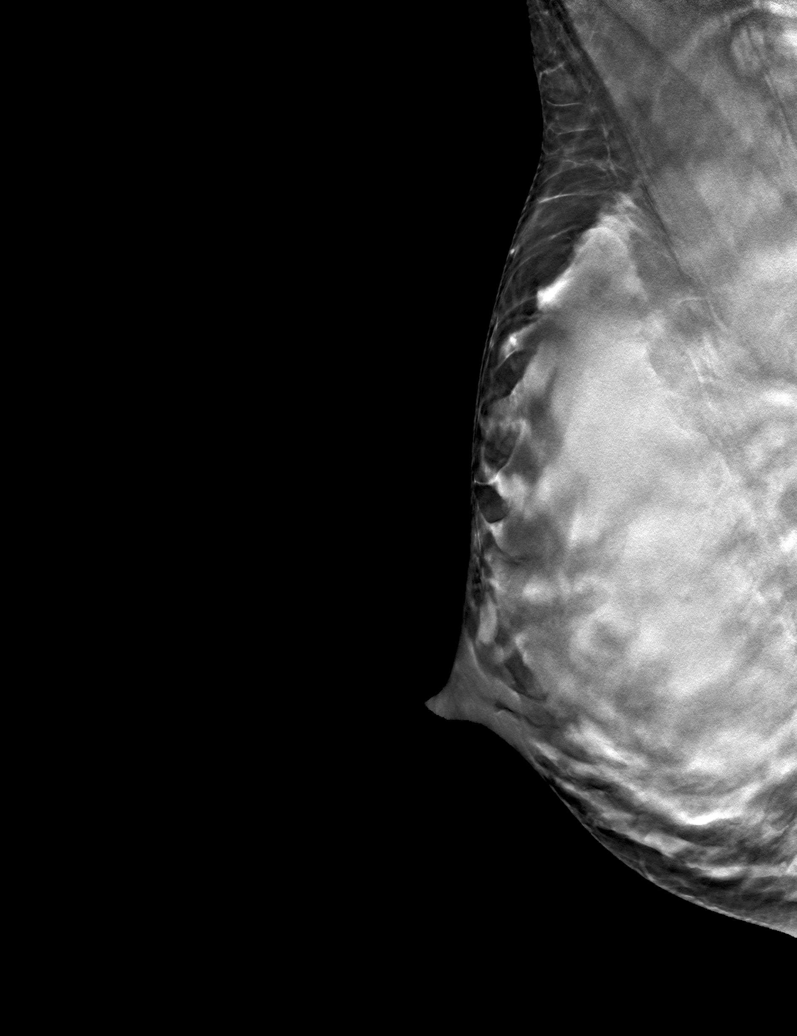

[6 of 18 positions shown; findings below may reference images not displayed]

ACR Breast Density Category d: The breast tissue is extremely dense,
which lowers the sensitivity of mammography.
FINDINGS: Within the UPPER-OUTER QUADRANT of the RIGHT breast there is a
superficial oval mass with lucent center in the region of the
patient's palpable abnormality, which has been marked with a BB. A
similar oval circumscribed mass is identified more inferiorly in the
UPPER-OUTER QUADRANT. LEFT breast is negative

Mammographic images were processed with CAD.

On physical exam, I palpate a discrete mobile superficial mass in
the 11 o'clock location of the RIGHT breast, axillary tail region
corresponding to the area of concern.

Targeted ultrasound is performed, showing a superficial oval
parallel hypoechoic mass with hyperechoic notch measuring 0.8 x
x 0.8 centimeters. Hilar flow is identified, consistent with
intramammary lymph node.

In the 9:30 o'clock location of the RIGHT breast 8 centimeters from
the nipple, a similar mass is 0.8 x 0.3 x 0.3 centimeters, also
consistent with intramammary lymph node.

In the 11:30 o'clock location 4 centimeters from the nipple, a
similar mass is 0.7 x 0.7 x 0.6 centimeters, consistent with an
intramammary lymph node.
IMPRESSION: 1.  No mammographic or ultrasound evidence for malignancy.
2. Palpable abnormality and other oval masses in the UPPER-OUTER
QUADRANT of the RIGHT breast are consistent with benign intramammary
lymph nodes.

RECOMMENDATION:
Screening mammogram at age 40 unless there are persistent or
intervening clinical concerns. (Code:Q4-3-GNI)

I have discussed the findings and recommendations with the patient.
If applicable, a reminder letter will be sent to the patient
regarding the next appointment.

BI-RADS CATEGORY  2: Benign.

## 2021-07-08 ENCOUNTER — Inpatient Hospital Stay (HOSPITAL_COMMUNITY)
Admission: AD | Admit: 2021-07-08 | Discharge: 2021-07-08 | Disposition: A | Discharge status: Home / Self Care | Payer: BC Managed Care – PPO | Attending: Obstetrics and Gynecology | Admitting: Obstetrics and Gynecology

## 2021-07-08 ENCOUNTER — Encounter (HOSPITAL_COMMUNITY): Payer: Self-pay

## 2021-07-08 ENCOUNTER — Other Ambulatory Visit: Payer: Self-pay

## 2021-07-08 DIAGNOSIS — Z3A35 35 weeks gestation of pregnancy: Secondary | ICD-10-CM | POA: Diagnosis not present

## 2021-07-08 DIAGNOSIS — Z880 Allergy status to penicillin: Secondary | ICD-10-CM | POA: Diagnosis not present

## 2021-07-08 DIAGNOSIS — O99891 Other specified diseases and conditions complicating pregnancy: Secondary | ICD-10-CM

## 2021-07-08 DIAGNOSIS — O133 Gestational [pregnancy-induced] hypertension without significant proteinuria, third trimester: Secondary | ICD-10-CM | POA: Diagnosis not present

## 2021-07-08 DIAGNOSIS — R109 Unspecified abdominal pain: Secondary | ICD-10-CM | POA: Diagnosis present

## 2021-07-08 DIAGNOSIS — Z885 Allergy status to narcotic agent status: Secondary | ICD-10-CM | POA: Diagnosis not present

## 2021-07-08 DIAGNOSIS — R030 Elevated blood-pressure reading, without diagnosis of hypertension: Secondary | ICD-10-CM | POA: Diagnosis not present

## 2021-07-08 DIAGNOSIS — N898 Other specified noninflammatory disorders of vagina: Secondary | ICD-10-CM

## 2021-07-08 LAB — COMPREHENSIVE METABOLIC PANEL
ALT: 14 U/L (ref 0–44)
AST: 22 U/L (ref 15–41)
Albumin: 2.7 g/dL — ABNORMAL LOW (ref 3.5–5.0)
Alkaline Phosphatase: 106 U/L (ref 38–126)
Anion gap: 6 (ref 5–15)
BUN: 8 mg/dL (ref 6–20)
CO2: 23 mmol/L (ref 22–32)
Calcium: 8.4 mg/dL — ABNORMAL LOW (ref 8.9–10.3)
Chloride: 107 mmol/L (ref 98–111)
Creatinine, Ser: 0.66 mg/dL (ref 0.44–1.00)
GFR, Estimated: >60 mL/min (ref >60)
Glucose, Bld: 75 mg/dL (ref 70–99)
Potassium: 4.1 mmol/L (ref 3.5–5.1)
Sodium: 136 mmol/L (ref 135–145)
Total Bilirubin: 0.6 mg/dL (ref 0.3–1.2)
Total Protein: 5.8 g/dL — ABNORMAL LOW (ref 6.5–8.1)

## 2021-07-08 LAB — URINALYSIS, ROUTINE W REFLEX MICROSCOPIC
Bilirubin Urine: NEGATIVE
Glucose, UA: NEGATIVE mg/dL
Hgb urine dipstick: NEGATIVE
Ketones, ur: NEGATIVE mg/dL
Nitrite: NEGATIVE
Protein, ur: NEGATIVE mg/dL
Specific Gravity, Urine: 1.02 (ref 1.005–1.030)
pH: 6 (ref 5.0–8.0)

## 2021-07-08 LAB — PROTEIN / CREATININE RATIO, URINE
Creatinine, Urine: 128.75 mg/dL
Protein Creatinine Ratio: 0.14 mg/mg{Cre} (ref 0.00–0.15)
Total Protein, Urine: 18 mg/dL

## 2021-07-08 LAB — CBC
HCT: 34.9 % — ABNORMAL LOW (ref 36.0–46.0)
Hemoglobin: 11.8 g/dL — ABNORMAL LOW (ref 12.0–15.0)
MCH: 29.1 pg (ref 26.0–34.0)
MCHC: 33.8 g/dL (ref 30.0–36.0)
MCV: 86.2 fL (ref 80.0–100.0)
Platelets: 237 10*3/uL (ref 150–400)
RBC: 4.05 MIL/uL (ref 3.87–5.11)
RDW: 12.3 % (ref 11.5–15.5)
WBC: 8.6 10*3/uL (ref 4.0–10.5)
nRBC: 0 % (ref 0.0–0.2)

## 2021-07-08 LAB — WET PREP, GENITAL
Clue Cells Wet Prep HPF POC: NONE SEEN
Sperm: NONE SEEN
Trich, Wet Prep: NONE SEEN
Yeast Wet Prep HPF POC: NONE SEEN

## 2021-07-08 LAB — AMNISURE RUPTURE OF MEMBRANE (ROM) NOT AT ARMC: Amnisure ROM: NEGATIVE

## 2021-07-08 LAB — POCT FERN TEST: POCT Fern Test: NEGATIVE

## 2021-07-08 NOTE — MAU Provider Note (Signed)
Patient Nancy Boyer is a 35 y.o.  [phone]  At [redacted]w[redacted]d here with complaints of leaking of fluid since 5 pm last night. She reports occasional gushes; she couldn't tell what color it was but it looked yellow on her toilet paper. She denies vaginal bleeding, decreased fetal movements. She also reports some contractions and some diarrhea. She denies blurry vision, floating spots and has a mild HA due to dehydration.  History     CSN: [id number]  Arrival date and time: 07/08/21 0950   Event Date/Time   First Provider Initiated Contact with Patient 07/08/21 1059      Chief Complaint  Patient presents with   Rupture of Membranes   Abdominal Pain   Back Pain   Abdominal Pain This is a new problem. The current episode started yesterday. The problem occurs intermittently. The problem has been unchanged. Quality: feels like BH; doesn't hurt but feels more like pressure. Associated symptoms include diarrhea. Pertinent negatives include no constipation, fever or vomiting.  Back Pain Associated symptoms include abdominal pain. Pertinent negatives include no fever.  Vaginal Discharge The patient's primary symptoms include vaginal discharge. The patient's pertinent negatives include no vaginal bleeding. The current episode started yesterday. The problem occurs intermittently. The pain is mild. Associated symptoms include abdominal pain, back pain and diarrhea. Pertinent negatives include no constipation, fever, urgency or vomiting. The vaginal discharge was watery, white, yellow and clear. There has been no bleeding. She has not been passing clots. She has not been passing tissue.   OB History     Gravida  6   Para  3   Term  3   Preterm      AB  2   Living  3      SAB      IAB      Ectopic  1   Multiple  0   Live Births  3           Past Medical History:  Diagnosis Date   Abdominal pain    Allergic conjunctivitis and rhinitis    Allergy    Anal or rectal pain     Blood in stool    Lactose intolerance    Medical history non-contributory    Migraine    Nausea & vomiting    NSVD (normal spontaneous vaginal delivery) 09/14/2013   Pre-eclampsia    Pregnancy induced hypertension    Urticaria    UTI (urinary tract infection)     Past Surgical History:  Procedure Laterality Date   BREAST BIOPSY Right    CYST REMOVAL HAND Left 2002   wrist   TONSILLECTOMY  2010   WISDOM TOOTH EXTRACTION  2003    Family History  Problem Relation Age of Onset   Prostate cancer Paternal Grandfather    Diabetes Paternal Grandfather    Alzheimer's disease Paternal Grandfather    Hypertension Mother    Allergic rhinitis Mother    Hypertension Father    Diabetes Paternal Grandmother    Diabetes Maternal Grandmother    Stroke Maternal Grandmother    Allergic rhinitis Sister    Eczema Sister    Asthma Sister    Migraines Neg Hx    Angioedema Neg Hx    Immunodeficiency Neg Hx    Urticaria Neg Hx     Social History   Tobacco Use   Smoking status: Never   Smokeless tobacco: Never  Vaping Use   Vaping Use: Never used  Substance Use Topics  Alcohol use: Not Currently    Comment: occ   Drug use: No    Allergies:  Allergies  Allergen Reactions   Penicillins Swelling and Rash    Has patient had a PCN reaction causing immediate rash, facial/tongue/throat swelling, SOB or lightheadedness with hypotension: Yes Has patient had a PCN reaction causing severe rash involving mucus membranes or skin necrosis: Yes Has patient had a PCN reaction that required hospitalization No Has patient had a PCN reaction occurring within the last 10 years: Yes If all of the above answers are "NO", then may proceed with Cephalosporin use.    Vancomycin Itching and Swelling   Codeine Nausea And Vomiting    Unknown childhood rxn    Medications Prior to Admission  Medication Sig Dispense Refill Last Dose   Prenatal Vit-Fe Fumarate-FA (PRENATAL MULTIVITAMIN) TABS tablet Take  1 tablet by mouth daily at 12 noon.   07/07/2021   valACYclovir (VALTREX) 500 MG tablet        Review of Systems  Constitutional:  Negative for fever.  Gastrointestinal:  Positive for abdominal pain and diarrhea. Negative for constipation and vomiting.  Genitourinary:  Positive for vaginal discharge. Negative for urgency.  Musculoskeletal:  Positive for back pain.  Physical Exam   Blood pressure (!) 136/95, pulse 78, temperature 98.5 F (36.9 C), temperature source Oral, resp. rate 19, SpO2 99 %, not currently breastfeeding.  Physical Exam Constitutional:      Appearance: She is well-developed.  Abdominal:     Palpations: Abdomen is soft.     Tenderness: There is no abdominal tenderness.  Genitourinary:    Vagina: Vaginal discharge present.     Cervix: Normal.     Comments: Milky white discharge in the vagina, no pooling,  cervix feels long, FT, posterior Skin:    General: Skin is warm.     Capillary Refill: Capillary refill takes less than 2 seconds.  Neurological:     General: No focal deficit present.     Mental Status: She is alert.  Psychiatric:        Mood and Affect: Mood normal.    MAU Course  Procedures  MDM -NST: 150 bpm, mod var, present acel, no decels, no contractions -amnisure and wet prep are negative; she had elevated BPs (mild range) while in MAU so full pre-e work-up was done. All pre-e labs were negative. She initially had a HA when she got to MAU but after snack and rest it went away.   -cervix not rechecked as contractions have not gotten stronger.  Assessment and Plan   1. Transient hypertension   2. Vaginal discharge    -patient has no evidence of SROM, explained physiologic changes in pregnancy and increase in discharge later in gestation.  -discussed that she does have consistently elevated BPs today; her pre-e labs were normal but she should keep appt tomorrow with Dr. Vincente Poli. Explained that if she has elevated BPs tomorrow at her appt she will  meet criteria for [facility] and that may change her plan of care/delivery gestation, etc. Patient understands importance of keeping appointment tomorrow. ALso reviewed strict pre-e precautions and when to come back to MAU; patient has access to a BP cuff and I encouraged her to check her BP if she is concerned about HA, blurry vision. I explained how to take BP (resting, feet flat, etc) and that MAU is open 24/7 if she is at all concerned about her BP or symptoms.   -patient and husband verbalized understanding and  she will keep appt tomorrow.  Charlesetta Garibaldi Amyra Vantuyl 07/08/2021, 11:15 AM

## 2021-07-08 NOTE — MAU Note (Signed)
...  Nancy Boyer is a 35 y.o. at [redacted]w[redacted]d here in MAU reporting: Patient states she felt a gush of fluids around 1700 while on the way to her daughters soccer game yesterday. She states the fluid has continually leaked out and has caused her underwear to remain wet. She states the fluid has been clear but this morning she noticed the fluids had a yellow tint to it and was more like mucous. She states she has also been experiencing intermittent lower back and lower abdominal pain since 2000 last night that she rates a 4/10. She states she went to sleep last night and woke up around 0400 this morning and was still feeling her CTX. She states she called the on call nurse and they instructed her to be evaluated here in MAU. +FM. No VB.  BP: 137/97 P: 78 T: 98.5 F Oral  R: 19 O2: 100%  FHT: 145 initial external Lab orders placed from triage: UA

## 2021-07-18 LAB — OB RESULTS CONSOLE GBS: GBS: NEGATIVE

## 2021-08-06 ENCOUNTER — Other Ambulatory Visit: Payer: Self-pay | Admitting: Obstetrics and Gynecology

## 2021-08-07 ENCOUNTER — Encounter (HOSPITAL_COMMUNITY): Payer: Self-pay | Admitting: Obstetrics and Gynecology

## 2021-08-07 ENCOUNTER — Other Ambulatory Visit: Payer: Self-pay | Admitting: Obstetrics and Gynecology

## 2021-08-07 LAB — SARS CORONAVIRUS 2 (TAT 6-24 HRS): SARS Coronavirus 2: NEGATIVE

## 2021-08-08 ENCOUNTER — Encounter (HOSPITAL_COMMUNITY): Payer: Self-pay | Admitting: Obstetrics and Gynecology

## 2021-08-08 ENCOUNTER — Inpatient Hospital Stay (HOSPITAL_COMMUNITY): Payer: BC Managed Care – PPO | Admitting: Anesthesiology

## 2021-08-08 ENCOUNTER — Inpatient Hospital Stay (HOSPITAL_COMMUNITY): Payer: BC Managed Care – PPO

## 2021-08-08 ENCOUNTER — Inpatient Hospital Stay (HOSPITAL_COMMUNITY)
Admission: AD | Admit: 2021-08-08 | Discharge: 2021-08-10 | DRG: 807 | Disposition: A | Discharge status: Home / Self Care | Payer: BC Managed Care – PPO | Attending: Obstetrics and Gynecology | Admitting: Obstetrics and Gynecology

## 2021-08-08 ENCOUNTER — Other Ambulatory Visit: Payer: Self-pay

## 2021-08-08 DIAGNOSIS — Z3490 Encounter for supervision of normal pregnancy, unspecified, unspecified trimester: Secondary | ICD-10-CM

## 2021-08-08 DIAGNOSIS — O26893 Other specified pregnancy related conditions, third trimester: Secondary | ICD-10-CM | POA: Diagnosis present

## 2021-08-08 DIAGNOSIS — O1414 Severe pre-eclampsia complicating childbirth: Principal | ICD-10-CM | POA: Diagnosis present

## 2021-08-08 DIAGNOSIS — Z3A39 39 weeks gestation of pregnancy: Secondary | ICD-10-CM

## 2021-08-08 HISTORY — DX: Unspecified abnormal cytological findings in specimens from vagina: R87.629

## 2021-08-08 LAB — COMPREHENSIVE METABOLIC PANEL
ALT: 17 U/L (ref 0–44)
AST: 24 U/L (ref 15–41)
Albumin: 2.9 g/dL — ABNORMAL LOW (ref 3.5–5.0)
Alkaline Phosphatase: 167 U/L — ABNORMAL HIGH (ref 38–126)
Anion gap: 11 (ref 5–15)
BUN: 12 mg/dL (ref 6–20)
CO2: 22 mmol/L (ref 22–32)
Calcium: 8.6 mg/dL — ABNORMAL LOW (ref 8.9–10.3)
Chloride: 104 mmol/L (ref 98–111)
Creatinine, Ser: 0.65 mg/dL (ref 0.44–1.00)
GFR, Estimated: >60 mL/min (ref >60)
Glucose, Bld: 69 mg/dL — ABNORMAL LOW (ref 70–99)
Potassium: 3.8 mmol/L (ref 3.5–5.1)
Sodium: 137 mmol/L (ref 135–145)
Total Bilirubin: 0.4 mg/dL (ref 0.3–1.2)
Total Protein: 6.2 g/dL — ABNORMAL LOW (ref 6.5–8.1)

## 2021-08-08 LAB — TYPE AND SCREEN
ABO/RH(D): B POS
Antibody Screen: NEGATIVE

## 2021-08-08 LAB — CBC
HCT: 35.5 % — ABNORMAL LOW (ref 36.0–46.0)
Hemoglobin: 12 g/dL (ref 12.0–15.0)
MCH: 28.8 pg (ref 26.0–34.0)
MCHC: 33.8 g/dL (ref 30.0–36.0)
MCV: 85.1 fL (ref 80.0–100.0)
Platelets: 269 10*3/uL (ref 150–400)
RBC: 4.17 MIL/uL (ref 3.87–5.11)
RDW: 12.8 % (ref 11.5–15.5)
WBC: 9.2 10*3/uL (ref 4.0–10.5)
nRBC: 0 % (ref 0.0–0.2)

## 2021-08-08 MED ORDER — ZOLPIDEM TARTRATE 5 MG PO TABS: 5.0000 mg | ORAL_TABLET | Freq: Every evening | ORAL | Status: DC | PRN

## 2021-08-08 MED ORDER — PHENYLEPHRINE 40 MCG/ML (10ML) SYRINGE FOR IV PUSH (FOR BLOOD PRESSURE SUPPORT): 80.0000 ug | PREFILLED_SYRINGE | INTRAVENOUS | Status: DC | PRN

## 2021-08-08 MED ORDER — TERBUTALINE SULFATE 1 MG/ML IJ SOLN: 0.2500 mg | Freq: Once | INTRAMUSCULAR | Status: DC | PRN

## 2021-08-08 MED ORDER — FENTANYL CITRATE (PF) 100 MCG/2ML IJ SOLN
100.0000 ug | Freq: Once | INTRAMUSCULAR | Status: AC
  Administered 2021-08-08: 100 ug via EPIDURAL

## 2021-08-08 MED ORDER — ONDANSETRON HCL 4 MG/2ML IJ SOLN: 4.0000 mg | Freq: Four times a day (QID) | INTRAMUSCULAR | Status: DC | PRN

## 2021-08-08 MED ORDER — OXYTOCIN BOLUS FROM INFUSION
333.0000 mL | Freq: Once | INTRAVENOUS | Status: AC
  Administered 2021-08-08: 333 mL via INTRAVENOUS

## 2021-08-08 MED ORDER — DIPHENHYDRAMINE HCL 50 MG/ML IJ SOLN: 12.5000 mg | INTRAMUSCULAR | Status: DC | PRN

## 2021-08-08 MED ORDER — ACETAMINOPHEN 325 MG PO TABS
650.0000 mg | ORAL_TABLET | ORAL | Status: DC | PRN
  Administered 2021-08-09 – 2021-08-10 (×3): 650 mg via ORAL
  Filled 2021-08-08 (×3): qty 2

## 2021-08-08 MED ORDER — LIDOCAINE HCL (PF) 1 % IJ SOLN: 30.0000 mL | INTRAMUSCULAR | Status: DC | PRN

## 2021-08-08 MED ORDER — LACTATED RINGERS IV SOLN: 500.0000 mL | Freq: Once | INTRAVENOUS | Status: DC

## 2021-08-08 MED ORDER — PRENATAL MULTIVITAMIN CH
1.0000 | ORAL_TABLET | Freq: Every day | ORAL | Status: DC
  Administered 2021-08-09: 1 via ORAL
  Filled 2021-08-08: qty 1

## 2021-08-08 MED ORDER — EPHEDRINE 5 MG/ML INJ: 10.0000 mg | INTRAVENOUS | Status: DC | PRN

## 2021-08-08 MED ORDER — BENZOCAINE-MENTHOL 20-0.5 % EX AERO: 1.0000 "application " | INHALATION_SPRAY | CUTANEOUS | Status: DC | PRN

## 2021-08-08 MED ORDER — SOD CITRATE-CITRIC ACID 500-334 MG/5ML PO SOLN: 30.0000 mL | ORAL | Status: DC | PRN

## 2021-08-08 MED ORDER — OXYCODONE HCL 5 MG PO TABS: 10.0000 mg | ORAL_TABLET | ORAL | Status: DC | PRN

## 2021-08-08 MED ORDER — COCONUT OIL OIL: 1.0000 "application " | TOPICAL_OIL | Status: DC | PRN

## 2021-08-08 MED ORDER — MEDROXYPROGESTERONE ACETATE 150 MG/ML IM SUSP: 150.0000 mg | INTRAMUSCULAR | Status: DC | PRN

## 2021-08-08 MED ORDER — DIBUCAINE (PERIANAL) 1 % EX OINT: 1.0000 "application " | TOPICAL_OINTMENT | CUTANEOUS | Status: DC | PRN

## 2021-08-08 MED ORDER — FENTANYL CITRATE (PF) 100 MCG/2ML IJ SOLN
INTRAMUSCULAR | Status: AC
  Filled 2021-08-08: qty 2

## 2021-08-08 MED ORDER — FLEET ENEMA 7-19 GM/118ML RE ENEM: 1.0000 | ENEMA | Freq: Every day | RECTAL | Status: DC | PRN

## 2021-08-08 MED ORDER — FENTANYL-BUPIVACAINE-NACL 0.5-0.125-0.9 MG/250ML-% EP SOLN
12.0000 mL/h | EPIDURAL | Status: DC | PRN
  Administered 2021-08-08: 12 mL/h via EPIDURAL
  Filled 2021-08-08: qty 250

## 2021-08-08 MED ORDER — OXYCODONE HCL 5 MG PO TABS: 5.0000 mg | ORAL_TABLET | ORAL | Status: DC | PRN

## 2021-08-08 MED ORDER — OXYCODONE-ACETAMINOPHEN 5-325 MG PO TABS: 2.0000 | ORAL_TABLET | ORAL | Status: DC | PRN

## 2021-08-08 MED ORDER — WITCH HAZEL-GLYCERIN EX PADS: 1.0000 "application " | MEDICATED_PAD | CUTANEOUS | Status: DC | PRN

## 2021-08-08 MED ORDER — LIDOCAINE HCL (PF) 1 % IJ SOLN
INTRAMUSCULAR | Status: DC | PRN
  Administered 2021-08-08: 7 mL via EPIDURAL

## 2021-08-08 MED ORDER — LIDOCAINE-EPINEPHRINE (PF) 1.5 %-1:200000 IJ SOLN
INTRAMUSCULAR | Status: DC | PRN
  Administered 2021-08-08: 3 mL via PERINEURAL

## 2021-08-08 MED ORDER — TETANUS-DIPHTH-ACELL PERTUSSIS 5-2.5-18.5 LF-MCG/0.5 IM SUSY: 0.5000 mL | PREFILLED_SYRINGE | Freq: Once | INTRAMUSCULAR | Status: DC

## 2021-08-08 MED ORDER — LABETALOL HCL 100 MG PO TABS
100.0000 mg | ORAL_TABLET | Freq: Once | ORAL | Status: AC
  Administered 2021-08-08: 100 mg via ORAL
  Filled 2021-08-08: qty 1

## 2021-08-08 MED ORDER — SIMETHICONE 80 MG PO CHEW: 80.0000 mg | CHEWABLE_TABLET | ORAL | Status: DC | PRN

## 2021-08-08 MED ORDER — ACETAMINOPHEN 325 MG PO TABS: 650.0000 mg | ORAL_TABLET | ORAL | Status: DC | PRN

## 2021-08-08 MED ORDER — BISACODYL 10 MG RE SUPP: 10.0000 mg | Freq: Every day | RECTAL | Status: DC | PRN

## 2021-08-08 MED ORDER — IBUPROFEN 600 MG PO TABS
600.0000 mg | ORAL_TABLET | Freq: Four times a day (QID) | ORAL | Status: DC
  Administered 2021-08-08 – 2021-08-10 (×6): 600 mg via ORAL
  Filled 2021-08-08 (×6): qty 1

## 2021-08-08 MED ORDER — ONDANSETRON HCL 4 MG PO TABS: 4.0000 mg | ORAL_TABLET | ORAL | Status: DC | PRN

## 2021-08-08 MED ORDER — LACTATED RINGERS IV SOLN: INTRAVENOUS | Status: DC

## 2021-08-08 MED ORDER — OXYTOCIN-SODIUM CHLORIDE 30-0.9 UT/500ML-% IV SOLN
1.0000 m[IU]/min | INTRAVENOUS | Status: DC
  Administered 2021-08-08: 2 m[IU]/min via INTRAVENOUS
  Filled 2021-08-08: qty 500

## 2021-08-08 MED ORDER — ONDANSETRON HCL 4 MG/2ML IJ SOLN: 4.0000 mg | INTRAMUSCULAR | Status: DC | PRN

## 2021-08-08 MED ORDER — DIPHENHYDRAMINE HCL 25 MG PO CAPS: 25.0000 mg | ORAL_CAPSULE | Freq: Four times a day (QID) | ORAL | Status: DC | PRN

## 2021-08-08 MED ORDER — MEASLES, MUMPS & RUBELLA VAC IJ SOLR: 0.5000 mL | Freq: Once | INTRAMUSCULAR | Status: DC

## 2021-08-08 MED ORDER — LACTATED RINGERS IV SOLN: 500.0000 mL | INTRAVENOUS | Status: DC | PRN

## 2021-08-08 MED ORDER — OXYCODONE-ACETAMINOPHEN 5-325 MG PO TABS: 1.0000 | ORAL_TABLET | ORAL | Status: DC | PRN

## 2021-08-08 MED ORDER — OXYTOCIN-SODIUM CHLORIDE 30-0.9 UT/500ML-% IV SOLN: 2.5000 [IU]/h | INTRAVENOUS | Status: DC

## 2021-08-08 MED ORDER — FENTANYL CITRATE (PF) 100 MCG/2ML IJ SOLN
INTRAMUSCULAR | Status: DC | PRN
  Administered 2021-08-08: 100 ug via EPIDURAL

## 2021-08-08 MED ORDER — SENNOSIDES-DOCUSATE SODIUM 8.6-50 MG PO TABS
2.0000 | ORAL_TABLET | Freq: Every day | ORAL | Status: DC
  Administered 2021-08-09: 2 via ORAL
  Filled 2021-08-08: qty 2

## 2021-08-08 MED ORDER — BUPIVACAINE HCL (PF) 0.25 % IJ SOLN
INTRAMUSCULAR | Status: DC | PRN
  Administered 2021-08-08: 8 mL via EPIDURAL

## 2021-08-08 NOTE — H&P (Signed)
Nancy Boyer is a  G 5 P 3013 at 39 w 6 days presents for elective IOL. OB History     Gravida  5   Para  3   Term  3   Preterm      AB  1   Living  3      SAB      IAB      Ectopic  1   Multiple  0   Live Births  3          Past Medical History:  Diagnosis Date   Abdominal pain    Allergic conjunctivitis and rhinitis    Allergy    Anal or rectal pain    Blood in stool    Lactose intolerance    Migraine    Nausea & vomiting    NSVD (normal spontaneous vaginal delivery) 09/14/2013   Pre-eclampsia    Pregnancy induced hypertension    Urticaria    UTI (urinary tract infection)    Vaginal Pap smear, abnormal    Past Surgical History:  Procedure Laterality Date   BREAST BIOPSY Right    CYST REMOVAL HAND Left 2002   wrist   TONSILLECTOMY  2010   WISDOM TOOTH EXTRACTION  2003   Family History: family history includes Allergic rhinitis in her mother and sister; Alzheimer's disease in her paternal grandfather; Asthma in her sister; Diabetes in her maternal grandmother, paternal grandfather, and paternal grandmother; Eczema in her sister; Hypertension in her father and mother; Prostate cancer in her paternal grandfather; Stroke in her maternal grandmother. Social History:  reports that she has never smoked. She has never used smokeless tobacco. She reports that she does not currently use alcohol. She reports that she does not use drugs.     Maternal Diabetes: No Genetic Screening: Normal Maternal Ultrasounds/Referrals: Normal Fetal Ultrasounds or other Referrals:  None Maternal Substance Abuse:  No Significant Maternal Medications:  None Significant Maternal Lab Results:  Group B Strep negative Other Comments:  None  Review of Systems  All other systems reviewed and are negative. History Dilation: 4.5 Effacement (%): 80 Station: -1 Exam by:: m wilkins rnc Blood pressure (!) 153/105, pulse 82, height 5\' 4"  (1.626 m), weight 70.8 kg, not  currently breastfeeding. Maternal Exam:  Abdomen: Fetal presentation: vertex   Fetal Exam Fetal State Assessment: Category I - tracings are normal.  Physical Exam Vitals and nursing note reviewed. Exam conducted with a chaperone present.  Constitutional:      Appearance: Normal appearance.  Cardiovascular:     Rate and Rhythm: Normal rate and regular rhythm.  Pulmonary:     Effort: Pulmonary effort is normal.  Neurological:     Mental Status: She is alert.    Prenatal labs: ABO, Rh: --/--/PENDING (12/08 1250) Antibody: PENDING (12/08 1250) Rubella: Immune (05/03 0000) RPR: Nonreactive (05/03 0000)  HBsAg: Negative (05/03 0000)  HIV: Non-reactive (05/03 0000)  GBS: Negative/-- (11/17 0000)   Assessment/Plan: IUP at term IOL Pitocin Epidural  05-22-1984 08/08/2021, 1:54 PM

## 2021-08-08 NOTE — Lactation Note (Signed)
This note was copied from a baby's chart. Lactation Consultation Note  Patient Name: Nancy Boyer IDHWY'S Date: 08/08/2021   Age:35 hours, P4 , term female infant. C called RN Efraim Kaufmann)  L&D on [facility] infant already breast and mom doesn't need LC services at this time. Maternal Data    Feeding    LATCH Score              Lactation Tools Discussed/Used    Interventions    Discharge    Consult Status      [name] 08/08/2021, 5:16 PM

## 2021-08-08 NOTE — Anesthesia Procedure Notes (Signed)
Epidural Patient location during procedure: OB Start time: 08/08/2021 1:57 PM End time: 08/08/2021 2:07 PM  Staffing Anesthesiologist: Lucretia Kern, MD Performed: anesthesiologist   Preanesthetic Checklist Completed: patient identified, IV checked, risks and benefits discussed, monitors and equipment checked, pre-op evaluation and timeout performed  Epidural Patient position: sitting Prep: DuraPrep Patient monitoring: heart rate, continuous pulse ox and blood pressure Approach: midline Location: L3-L4 Injection technique: LOR air  Needle:  Needle type: Tuohy  Needle gauge: 17 G Needle length: 9 cm Needle insertion depth: 4 cm Catheter type: closed end flexible Catheter size: 19 Gauge Catheter at skin depth: 9 cm Test dose: negative  Assessment Events: blood not aspirated, injection not painful, no injection resistance, no paresthesia and negative IV test  Additional Notes Reason for block:procedure for pain

## 2021-08-08 NOTE — Anesthesia Preprocedure Evaluation (Signed)
Anesthesia Evaluation  Patient identified by MRN, date of birth, ID band Patient awake    Reviewed: Allergy & Precautions, H&P , NPO status , Patient's Chart, lab work & pertinent test results  History of Anesthesia Complications Negative for: history of anesthetic complications  Airway Mallampati: II  TM Distance: >3 FB     Dental   Pulmonary neg pulmonary ROS,    Pulmonary exam normal        Cardiovascular hypertension, negative cardio ROS   Rhythm:regular Rate:Normal     Neuro/Psych negative neurological ROS  negative psych ROS   GI/Hepatic negative GI ROS, Neg liver ROS,   Endo/Other  negative endocrine ROS  Renal/GU negative Renal ROS  negative genitourinary   Musculoskeletal   Abdominal   Peds  Hematology negative hematology ROS (+)   Anesthesia Other Findings   Reproductive/Obstetrics (+) Pregnancy                             Anesthesia Physical Anesthesia Plan  ASA: 2  Anesthesia Plan: Epidural   Post-op Pain Management:    Induction:   PONV Risk Score and Plan:   Airway Management Planned:   Additional Equipment:   Intra-op Plan:   Post-operative Plan:   Informed Consent: I have reviewed the patients History and Physical, chart, labs and discussed the procedure including the risks, benefits and alternatives for the proposed anesthesia with the patient or authorized representative who has indicated his/her understanding and acceptance.       Plan Discussed with:   Anesthesia Plan Comments:         Anesthesia Quick Evaluation

## 2021-08-09 LAB — CBC
HCT: 29.7 % — ABNORMAL LOW (ref 36.0–46.0)
HCT: 32.4 % — ABNORMAL LOW (ref 36.0–46.0)
Hemoglobin: 10.4 g/dL — ABNORMAL LOW (ref 12.0–15.0)
Hemoglobin: 10.9 g/dL — ABNORMAL LOW (ref 12.0–15.0)
MCH: 29.7 pg (ref 26.0–34.0)
MCH: 28.9 pg (ref 26.0–34.0)
MCHC: 35 g/dL (ref 30.0–36.0)
MCHC: 33.6 g/dL (ref 30.0–36.0)
MCV: 84.9 fL (ref 80.0–100.0)
MCV: 85.9 fL (ref 80.0–100.0)
Platelets: 206 10*3/uL (ref 150–400)
Platelets: 234 10*3/uL (ref 150–400)
RBC: 3.5 MIL/uL — ABNORMAL LOW (ref 3.87–5.11)
RBC: 3.77 MIL/uL — ABNORMAL LOW (ref 3.87–5.11)
RDW: 13 % (ref 11.5–15.5)
RDW: 12.9 % (ref 11.5–15.5)
WBC: 10.7 10*3/uL — ABNORMAL HIGH (ref 4.0–10.5)
WBC: 10.8 10*3/uL — ABNORMAL HIGH (ref 4.0–10.5)
nRBC: 0 % (ref 0.0–0.2)
nRBC: 0 % (ref 0.0–0.2)

## 2021-08-09 LAB — COMPREHENSIVE METABOLIC PANEL
ALT: 14 U/L (ref 0–44)
AST: 23 U/L (ref 15–41)
Albumin: 2.4 g/dL — ABNORMAL LOW (ref 3.5–5.0)
Alkaline Phosphatase: 129 U/L — ABNORMAL HIGH (ref 38–126)
Anion gap: 4 — ABNORMAL LOW (ref 5–15)
BUN: 8 mg/dL (ref 6–20)
CO2: 24 mmol/L (ref 22–32)
Calcium: 8.4 mg/dL — ABNORMAL LOW (ref 8.9–10.3)
Chloride: 107 mmol/L (ref 98–111)
Creatinine, Ser: 0.71 mg/dL (ref 0.44–1.00)
GFR, Estimated: >60 mL/min (ref >60)
Glucose, Bld: 85 mg/dL (ref 70–99)
Potassium: 3.9 mmol/L (ref 3.5–5.1)
Sodium: 135 mmol/L (ref 135–145)
Total Bilirubin: 0.2 mg/dL — ABNORMAL LOW (ref 0.3–1.2)
Total Protein: 5.2 g/dL — ABNORMAL LOW (ref 6.5–8.1)

## 2021-08-09 LAB — RPR: RPR Ser Ql: NONREACTIVE

## 2021-08-09 LAB — RUBELLA SCREEN: Rubella: 1.83 index (ref >0.99)

## 2021-08-09 MED ORDER — LABETALOL HCL 200 MG PO TABS
200.0000 mg | ORAL_TABLET | Freq: Three times a day (TID) | ORAL | Status: DC
  Administered 2021-08-09 – 2021-08-10 (×3): 200 mg via ORAL
  Filled 2021-08-09 (×3): qty 1

## 2021-08-09 MED ORDER — MAGNESIUM SULFATE BOLUS VIA INFUSION
4.0000 g | Freq: Once | INTRAVENOUS | Status: AC
  Administered 2021-08-09: 4 g via INTRAVENOUS
  Filled 2021-08-09: qty 1000

## 2021-08-09 MED ORDER — LACTATED RINGERS IV SOLN: INTRAVENOUS | Status: DC

## 2021-08-09 MED ORDER — MAGNESIUM SULFATE 40 GM/1000ML IV SOLN
2.0000 g/h | INTRAVENOUS | Status: DC
  Administered 2021-08-09: 2 g/h via INTRAVENOUS
  Filled 2021-08-09 (×3): qty 1000

## 2021-08-09 NOTE — Progress Notes (Signed)
Feels a little better, HA improved  Today's Vitals   08/09/21 1247 08/09/21 1250 08/09/21 1430 08/09/21 1452  BP:  (!) 144/100 (!) 149/92 (!) 154/97  Pulse:  81 89 84  Resp:   17   Temp:      TempSrc:      SpO2:      Weight:      Height:      PainSc: 4   2     Body mass index is 26.78 kg/m.   Magnesium sulfate infusing DTR 2+  A/P: preeclampsia          D/W patient         Continue magnesium infusion         Start labetalol 200mg  po TID         Labs in am

## 2021-08-09 NOTE — Anesthesia Postprocedure Evaluation (Signed)
Anesthesia Post Note  Patient: Nancy Boyer  Procedure(s) Performed: AN AD HOC LABOR EPIDURAL     Patient location during evaluation: Mother Baby Anesthesia Type: Epidural Level of consciousness: awake and alert Pain management: pain level controlled Vital Signs Assessment: post-procedure vital signs reviewed and stable Respiratory status: spontaneous breathing, nonlabored ventilation and respiratory function stable Cardiovascular status: stable Postop Assessment: no headache, no backache and epidural receding Anesthetic complications: no   No notable events documented.  Last Vitals:  Vitals:   08/08/21 2310 08/09/21 0609  BP: 130/89 136/83  Pulse: 88 72  Resp: 17 18  Temp: 37 C 36.5 C  SpO2: 100% 100%    Last Pain:  Vitals:   08/09/21 0609  TempSrc: Oral  PainSc:    Pain Goal:                   [name]

## 2021-08-09 NOTE — Progress Notes (Signed)
Tired Had a mild HA and BP= 135/91. She was given Tylenol and took a nap. She woke with a worse HA and BP= 144/100 in both arms.  No vision change.  Today's Vitals   08/09/21 0930 08/09/21 1030 08/09/21 1247 08/09/21 1250  BP: (!) 135/91   (!) 144/100  Pulse: 80   81  Resp: 18     Temp: 98.2 F (36.8 C)     TempSrc: Oral     SpO2:      Weight:      Height:      PainSc: 2  1  4      Body mass index is 26.78 kg/m.   NAD DTR 1+  Labs just drawn.   A/P: Preeclampsia with severe features with worsening HA and climbing BP         D/W magnesium sulfate for sz prophylaxis

## 2021-08-09 NOTE — Progress Notes (Signed)
Post Partum Day 1 Subjective: no complaints, up ad lib, voiding, tolerating PO, and + flatus No HA, no vision change Objective: Blood pressure 136/83, pulse 72, temperature 97.7 F (36.5 C), temperature source Oral, resp. rate 18, height 5\' 4"  (1.626 m), weight 70.8 kg, SpO2 100 %, unknown if currently breastfeeding.  Physical Exam:  General: alert, cooperative, and no distress Lochia: appropriate Uterine Fundus: firm Incision: healing well DVT Evaluation: No evidence of DVT seen on physical exam.  Recent Labs    08/08/21 1300 08/09/21 0505  HGB 12.0 10.4*  HCT 35.5* 29.7*    Assessment/Plan: Plan for discharge tomorrow BP elevated in labor. Epidural did not relieve pain. Now BP normal and she is asymptomatic. Labs yesterday were OK. Will observe BP today and probable discharge in am. No circ  LOS: 1 day   14/09/22 II 08/09/2021, 7:14 AM

## 2021-08-09 NOTE — Progress Notes (Signed)
Patient blood pressure 144/100 in right arm and 145/100 in left arm and a worsening headache despite Tylenol and a restful nap.  Dr. Henderson Cloud called and notified of increasing headache and increasing blood pressures.  MD to come see patient and write orders for lab work and transfer to [facility] for Magnesium Sulfate.  [facility] Charge RN called and notified of pending orders and the need to transfer the patient to [facility].  Marquis Buggy, RN called and given report prior to transfer. Elmire Amrein D

## 2021-08-10 LAB — CBC
HCT: 31 % — ABNORMAL LOW (ref 36.0–46.0)
Hemoglobin: 10.5 g/dL — ABNORMAL LOW (ref 12.0–15.0)
MCH: 29.3 pg (ref 26.0–34.0)
MCHC: 33.9 g/dL (ref 30.0–36.0)
MCV: 86.6 fL (ref 80.0–100.0)
Platelets: 220 10*3/uL (ref 150–400)
RBC: 3.58 MIL/uL — ABNORMAL LOW (ref 3.87–5.11)
RDW: 13.1 % (ref 11.5–15.5)
WBC: 9.6 10*3/uL (ref 4.0–10.5)
nRBC: 0 % (ref 0.0–0.2)

## 2021-08-10 LAB — COMPREHENSIVE METABOLIC PANEL
ALT: 15 U/L (ref 0–44)
AST: 25 U/L (ref 15–41)
Albumin: 2.4 g/dL — ABNORMAL LOW (ref 3.5–5.0)
Alkaline Phosphatase: 119 U/L (ref 38–126)
Anion gap: 5 (ref 5–15)
BUN: 8 mg/dL (ref 6–20)
CO2: 26 mmol/L (ref 22–32)
Calcium: 6.7 mg/dL — ABNORMAL LOW (ref 8.9–10.3)
Chloride: 105 mmol/L (ref 98–111)
Creatinine, Ser: 0.71 mg/dL (ref 0.44–1.00)
GFR, Estimated: >60 mL/min (ref >60)
Glucose, Bld: 101 mg/dL — ABNORMAL HIGH (ref 70–99)
Potassium: 3.9 mmol/L (ref 3.5–5.1)
Sodium: 136 mmol/L (ref 135–145)
Total Bilirubin: <0.1 mg/dL — ABNORMAL LOW (ref 0.3–1.2)
Total Protein: 5.2 g/dL — ABNORMAL LOW (ref 6.5–8.1)

## 2021-08-10 LAB — MAGNESIUM: Magnesium: 3.6 mg/dL — ABNORMAL HIGH (ref 1.7–2.4)

## 2021-08-10 MED ORDER — LABETALOL HCL 200 MG PO TABS
200.0000 mg | ORAL_TABLET | Freq: Three times a day (TID) | ORAL | 1 refills | Status: AC
Start: 2021-08-10 — End: ?

## 2021-08-10 MED ORDER — IBUPROFEN 600 MG PO TABS: 600.0000 mg | ORAL_TABLET | Freq: Four times a day (QID) | ORAL | 0 refills | Status: AC | PRN

## 2021-08-10 MED ORDER — ACETAMINOPHEN 325 MG PO TABS: 650.0000 mg | ORAL_TABLET | Freq: Four times a day (QID) | ORAL | 0 refills | Status: AC | PRN

## 2021-08-10 NOTE — Progress Notes (Signed)
Post Partum Day 2 Subjective: no complaints, up ad lib, voiding, tolerating PO, and + flatus Sleeping Objective: Blood pressure 130/87, pulse 82, temperature 97.6 F (36.4 C), temperature source Oral, resp. rate 17, height 5\' 4"  (1.626 m), weight 70.8 kg, SpO2 99 %, unknown if currently breastfeeding.  Physical Exam:  General: no distress Lochia: appropriate Uterine Fundus: firm Incision: healing well DVT Evaluation: No evidence of DVT seen on physical exam.  Recent Labs    08/09/21 1310 08/10/21 0414  HGB 10.9* 10.5*  HCT 32.4* 31.0*    Assessment/Plan: Discharge home D/W husband discharge Patient finally getting some sleep this am Labetalol 200mg  TID BP check in office 6-9 days  LOS: 2 days   14/10/22 II 08/10/2021, 7:14 AM

## 2021-08-10 NOTE — Progress Notes (Signed)
I received call from nurse that patient felt very bad on magnesium and insisted on stopping it. It was then discontinued. Now patient states she feels better. She states she understands there is some risk of seizure activity and is willing to accept that to feel better.  Today's Vitals   08/09/21 2227 08/09/21 2300 08/10/21 0022 08/10/21 0130  BP:   (!) 135/102   Pulse:   83   Resp:  17 18   Temp:   97.6 F (36.4 C)   TempSrc:   Oral   SpO2:   98%   Weight:      Height:      PainSc: 0-No pain   4    Body mass index is 26.78 kg/m.  DTR 2+  A/P: Will continue labetalol 200mg  TID

## 2021-08-10 NOTE — Plan of Care (Signed)
Patient to receive printed discharge instructions. Carmelina Dane, RN

## 2021-08-10 NOTE — Discharge Summary (Signed)
Postpartum Discharge Summary  Date of Service updated 08/10/21     Patient Name: Nancy Boyer DOB: 1985-12-12 MRN: [id number]  Date of admission: 08/08/2021 Delivery date:08/08/2021  Delivering provider: Dian Queen  Date of discharge: 08/10/2021  Admitting diagnosis: Pregnancy [Z34.90] NSVD (normal spontaneous vaginal delivery) [O80] Intrauterine pregnancy: [redacted]w[redacted]d    Secondary diagnosis:  Active Problems:   Pregnancy   NSVD (normal spontaneous vaginal delivery)  Additional problems: preeclamsia    Discharge diagnosis: Term Pregnancy Delivered and Preeclampsia (severe)                                              Post partum procedures: Augmentation: AROM and Pitocin Complications: None  Hospital course: Induction of Labor With Vaginal Delivery   35y.o. yo [id number] admitted to the hospital 08/08/2021 for induction of labor.  Indication for induction: Favorable cervix at term.  Patient had an uncomplicated labor course as follows: Membrane Rupture Time/Date: 1:50 PM ,08/08/2021   Delivery Method:Vaginal, Spontaneous  Episiotomy: None  Lacerations:  None  Details of delivery can be found in separate delivery note.  Patient had a routine postpartum course. Patient is discharged home 08/10/21.  Newborn Data: Birth date:08/08/2021  Birth time:4:12 PM  Gender:Female  Living status:Living  Apgars:8 ,9  Weight:2590 g   Magnesium Sulfate received: Yes: Seizure prophylaxis post partum BMZ received: No Rhophylac:No MMR:No T-DaP:Given prenatally Flu: No Transfusion:No  Physical exam  Vitals:   08/09/21 2200 08/09/21 2300 08/10/21 0022 08/10/21 0415  BP:   (!) 135/102 130/87  Pulse:   83 82  Resp: 17 17 18 17   Temp:   97.6 F (36.4 C) 97.6 F (36.4 C)  TempSrc:   Oral Oral  SpO2:   98% 99%  Weight:      Height:       General: alert, cooperative, and no distress Lochia: appropriate Uterine Fundus: firm Incision: Healing well with no  significant drainage DVT Evaluation: No evidence of DVT seen on physical exam. Labs: Lab Results  Component Value Date   WBC 9.6 08/10/2021   HGB 10.5 (L) 08/10/2021   HCT 31.0 (L) 08/10/2021   MCV 86.6 08/10/2021   PLT 220 08/10/2021   CMP Latest Ref Rng & Units 08/10/2021  Glucose 70 - 99 mg/dL 101(H)  BUN 6 - 20 mg/dL 8  Creatinine 0.44 - 1.00 mg/dL 0.71  Sodium 135 - 145 mmol/L 136  Potassium 3.5 - 5.1 mmol/L 3.9  Chloride 98 - 111 mmol/L 105  CO2 22 - 32 mmol/L 26  Calcium 8.9 - 10.3 mg/dL 6.7(L)  Total Protein 6.5 - 8.1 g/dL 5.2(L)  Total Bilirubin 0.3 - 1.2 mg/dL <0.1(L)  Alkaline Phos 38 - 126 U/L 119  AST 15 - 41 U/L 25  ALT 0 - 44 U/L 15   Edinburgh Score: Edinburgh Postnatal Depression Scale Screening Tool 08/09/2021  I have been able to laugh and see the funny side of things. 0  I have looked forward with enjoyment to things. 0  I have blamed myself unnecessarily when things went wrong. 0  I have been anxious or worried for no good reason. 0  I have felt scared or panicky for no good reason. 0  Things have been getting on top of me. 0  I have been so unhappy that I have had difficulty sleeping.  0  I have felt sad or miserable. 0  I have been so unhappy that I have been crying. 0  The thought of harming myself has occurred to me. 0  Edinburgh Postnatal Depression Scale Total 0      After visit meds:  Allergies as of 08/10/2021       Reactions   Penicillins Swelling, Rash   Has patient had a PCN reaction causing immediate rash, facial/tongue/throat swelling, SOB or lightheadedness with hypotension: Yes Has patient had a PCN reaction causing severe rash involving mucus membranes or skin necrosis: Yes Has patient had a PCN reaction that required hospitalization No Has patient had a PCN reaction occurring within the last 10 years: Yes If all of the above answers are "NO", then may proceed with Cephalosporin use.   Vancomycin Itching, Swelling   Codeine  Nausea And Vomiting   Unknown childhood rxn        Medication List     STOP taking these medications    valACYclovir 500 MG tablet Commonly known as: VALTREX       TAKE these medications    acetaminophen 325 MG tablet Commonly known as: Tylenol Take 2 tablets (650 mg total) by mouth every 6 (six) hours as needed (for pain scale < 4).   ibuprofen 600 MG tablet Commonly known as: ADVIL Take 1 tablet (600 mg total) by mouth every 6 (six) hours as needed.   labetalol 200 MG tablet Commonly known as: NORMODYNE Take 1 tablet (200 mg total) by mouth 3 (three) times daily.         Discharge home in stable condition Infant Feeding: Breast Infant Disposition:home with mother Discharge instruction: per After Visit Summary and Postpartum booklet. Activity: Advance as tolerated. Pelvic rest for 6 weeks.  Diet: routine diet Anticipated Birth Control: Unsure Postpartum Appointment:6 weeks Additional Postpartum F/U: BP check 1 week Future Appointments:No future appointments. Follow up Visit:      08/10/2021 Allena Katz, MD

## 2021-08-10 NOTE — Discharge Instructions (Signed)
Call office for BP check in 6-9 days

## 2021-08-22 ENCOUNTER — Telehealth (HOSPITAL_COMMUNITY): Payer: Self-pay | Admitting: *Deleted

## 2021-08-22 NOTE — Telephone Encounter (Signed)
Attempted hospital discharge follow-up call. Left message for patient to return RN call. Deforest Hoyles, RN, 08/22/21, 1004
